# Patient Record
Sex: Male | Born: 1960 | Race: White | Hispanic: No | Marital: Married | Smoking: Never smoker
Health system: Southern US, Community
[De-identification: ages and names within clinical notes are randomized; demographics above are authoritative.]

## PROBLEM LIST (undated history)

## (undated) DIAGNOSIS — E785 Hyperlipidemia, unspecified: Secondary | ICD-10-CM

## (undated) DIAGNOSIS — I82409 Acute embolism and thrombosis of unspecified deep veins of unspecified lower extremity: Secondary | ICD-10-CM

## (undated) DIAGNOSIS — I10 Essential (primary) hypertension: Secondary | ICD-10-CM

## (undated) DIAGNOSIS — Z5189 Encounter for other specified aftercare: Secondary | ICD-10-CM

## (undated) DIAGNOSIS — T7840XA Allergy, unspecified, initial encounter: Secondary | ICD-10-CM

## (undated) DIAGNOSIS — I639 Cerebral infarction, unspecified: Secondary | ICD-10-CM

## (undated) DIAGNOSIS — R011 Cardiac murmur, unspecified: Secondary | ICD-10-CM

## (undated) DIAGNOSIS — D580 Hereditary spherocytosis: Secondary | ICD-10-CM

## (undated) DIAGNOSIS — K219 Gastro-esophageal reflux disease without esophagitis: Secondary | ICD-10-CM

## (undated) DIAGNOSIS — IMO0001 Reserved for inherently not codable concepts without codable children: Secondary | ICD-10-CM

## (undated) HISTORY — DX: Hereditary spherocytosis: D58.0

## (undated) HISTORY — DX: Allergy, unspecified, initial encounter: T78.40XA

## (undated) HISTORY — DX: Gastro-esophageal reflux disease without esophagitis: K21.9

## (undated) HISTORY — DX: Cerebral infarction, unspecified: I63.9

## (undated) HISTORY — DX: Reserved for inherently not codable concepts without codable children: IMO0001

## (undated) HISTORY — DX: Acute embolism and thrombosis of unspecified deep veins of unspecified lower extremity: I82.409

## (undated) HISTORY — PX: UMBILICAL HERNIA REPAIR: SHX196

## (undated) HISTORY — PX: SPLENECTOMY: SUR1306

## (undated) HISTORY — DX: Encounter for other specified aftercare: Z51.89

## (undated) HISTORY — PX: COLONOSCOPY: SHX174

## (undated) HISTORY — PX: VASECTOMY: SHX75

## (undated) HISTORY — DX: Essential (primary) hypertension: I10

## (undated) HISTORY — DX: Hyperlipidemia, unspecified: E78.5

## (undated) HISTORY — DX: Cardiac murmur, unspecified: R01.1

---

## 2006-01-04 ENCOUNTER — Emergency Department (HOSPITAL_COMMUNITY): Admission: EM | Admit: 2006-01-04 | Discharge: 2006-01-05 | Payer: Self-pay | Admitting: Emergency Medicine

## 2006-01-07 ENCOUNTER — Ambulatory Visit: Payer: Self-pay | Admitting: Cardiology

## 2006-01-07 ENCOUNTER — Inpatient Hospital Stay (HOSPITAL_COMMUNITY): Admission: EM | Admit: 2006-01-07 | Discharge: 2006-01-16 | Payer: Self-pay | Admitting: Emergency Medicine

## 2006-01-07 ENCOUNTER — Encounter: Admission: RE | Admit: 2006-01-07 | Discharge: 2006-01-07 | Payer: Self-pay | Admitting: Otolaryngology

## 2006-01-09 ENCOUNTER — Encounter: Payer: Self-pay | Admitting: Cardiology

## 2006-01-10 ENCOUNTER — Encounter: Payer: Self-pay | Admitting: Cardiology

## 2006-01-29 ENCOUNTER — Encounter: Admission: RE | Admit: 2006-01-29 | Discharge: 2006-04-29 | Payer: Self-pay | Admitting: Nurse Practitioner

## 2006-02-15 ENCOUNTER — Ambulatory Visit (HOSPITAL_COMMUNITY): Admission: RE | Admit: 2006-02-15 | Discharge: 2006-02-15 | Payer: Self-pay | Admitting: Ophthalmology

## 2006-03-06 ENCOUNTER — Ambulatory Visit (HOSPITAL_COMMUNITY): Admission: RE | Admit: 2006-03-06 | Discharge: 2006-03-06 | Payer: Self-pay | Admitting: Cardiology

## 2006-03-06 ENCOUNTER — Encounter (INDEPENDENT_AMBULATORY_CARE_PROVIDER_SITE_OTHER): Payer: Self-pay | Admitting: Cardiology

## 2008-07-07 ENCOUNTER — Ambulatory Visit (HOSPITAL_COMMUNITY): Admission: RE | Admit: 2008-07-07 | Discharge: 2008-07-07 | Payer: Self-pay | Admitting: Internal Medicine

## 2010-07-19 ENCOUNTER — Encounter: Payer: Self-pay | Admitting: Gastroenterology

## 2011-01-09 NOTE — Letter (Signed)
Summary: Pre Visit No Show Letter  [facility]  [place], [place] [address]   Phone: [phone]  Fax: [phone]        July 19, 2010 MRN: [id number]    [address] [place], [place]  [address]    Dear Mr. Vendetti,   We have been unable to reach you by phone concerning the pre-procedure visit that you missed on 07/19/10. For this reason,your procedure scheduled on 08/02/10 has been cancelled. Our scheduling staff will gladly assist you with rescheduling your appointments at a more convenient time. Please call our office at [phone] between the hours of 8:00am and 5:00pm, press option #2 to reach an appointment scheduler. Please consider updating your contact numbers at this time so that we can reach you by phone in the future with schedule changes or results.    Thank you,    Emerson Monte RN [facility]

## 2011-04-27 NOTE — Discharge Summary (Signed)
[name], Kurt Lloyd   MEDICAL RECORD NO.:  [id number]          PATIENT TYPE:  INP   LOCATION:  3029                         FACILITY:  [facility]   PHYSICIAN:  Larey Seat, M.D.  DATE OF BIRTH:  07-25-1961   DATE OF ADMISSION:  01/07/2006  DATE OF DISCHARGE:  01/16/2006                                 DISCHARGE SUMMARY   Kurt Lloyd is a 50 year old gentleman with a birth date [birth year] who was admitted on January 07, 2006 after having complaints for  multiple days of vertigo and severe headaches, feeling dizzy, and even  nauseated.  The patient had been sent home from the ER only to have to see  his family doctor who then as I understand ordered a CT scan which showed  bihemispheric cerebellar strokes.  The patient was admitted to the neurology  stroke service through Dr. 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  There  was no hemorrhagic conversion seen.  I would like to further add that Kurt Lloyd has no known history of high blood pressure, diabetes mellitus,  previous strokes, heart disease, nicotine abuse and hyperlipidemia had been  at one time addressed to him but he was not placed on any cholesterol  lowering drugs.  About one month prior to his admission to the hospital he  had transient double vision lasting about a minute.  Then on January 22 he  noted sudden onset of dizziness, vertigo, sharp pain occurring in his neck  radiating up to the occiput.  The headache became generalized later on.  He  had to stay out of work on Tuesday feeling not well.  On Wednesday he had  recurrence of the headache and so was seen in an urgent care by a P.A.  At  that time a CT of the brain was obtained and supposedly interpreted as  normal.  EKG was also obtained as normal.  On Monday,  January 22 he felt  increasing dizziness, lightheadedness, and headache.  During the evening  finally he got some relief but when he drove home his wife noticed that he  could not keep the car straight on the road.  She called 911 and he was  taken to the [facility] Emergency Room where he was treated with Phenergan,  Zofran, Benadryl and released from the hospital.  He slept most of the  coming Saturday and then on Sunday again felt sleepy, but at least had not  the same severity of headache.  Saw an ENT physician, Dr. Constance Holster.  An MRI  study was obtained showing a small right cerebellar stroke, medial right  middle cerebellar peduncle and the right mid brain seemed to be affected.  A  left cerebellar stroke was also noted.  An MRI showed flow abnormalities in  the left vertebral.  The patient's drug screens were negative.  He had been  on aspirin prior, but had not taken  it since Tuesday and he has no history  of known head or neck trauma.   The patient was on hydrocodone and transcobalamin patch.  He is supposed to  be taking aspirin, but was noncompliant.   Further testing here showed a right-sided carotid artery evaluation by  Doppler with the result of a 40-60% ICA stenosis.  No stenosis was seen on  the left ICA.  Right vertebral artery flow was antegrade.  Left vertebral  artery flow was not seen or was too slow to be verified.  A 2-D  echocardiogram was completed showing no source of embolism.  Speech  pathologists saw the patient on the 31st of January and signed off stating  that the patient needs no assistance with communication skills.  The patient  remained through the hospital stay in no acute distress, but was placed on  heparin and Coumadin, the heparin to bridge over until the Coumadin became  therapeutic which was today on January 16, 2006 with an INR of 1.9.  Goal  rate for the Coumadin level is 2-3 INR.  Physical findings at this time  still include right-sided ataxia  of arm and leg, decreased finger-nose test.  Rapid alternating movements are more hesitant on the right but the strength  in general is normal.  There is a slight decrease in right-sided deep tendon  reflexes.  A homocysteine level had been obtained at 14.2  Cholesterol was  185.  Overall HBA1c was 4.9 and patient's physical therapist recommended to  have outpatient OT and PT follow the patient.  A transesophageal  echocardiogram was attached to the chart undated and also showed no source  of embolism.  The patient is today ready for discharge to his home  environment.  He requested to be referred to an internal medicine specialist  in town  and will be released today on 7.5 mg Coumadin daily.  He was placed on  Zocor, baby aspirin 81 daily, Ambien p.r.n. insomnia, and Tylenol 650 mg  p.r.n. for headaches.  Follow up with Dr. Antony Contras in six to eight weeks  after discharge.  I will follow the patient's request and refer him today to  an internist in town.      Larey Seat, M.D.  Electronically Signed     CD/MEDQ  D:  01/16/2006  T:  01/16/2006  Job:  [id number]   cc:   Pramod P. Leonie Man, MD  Fax: [phone]

## 2011-04-27 NOTE — H&P (Signed)
NAMELABIB, BUJNOWSKI NO.:  [id number]   MEDICAL RECORD NO.:  [id number]          PATIENT TYPE:  INP   LOCATION:  3029                         FACILITY:  [facility]   PHYSICIAN:  Alyson Locket. Love, M.D.    DATE OF BIRTH:  [birth year]   DATE OF ADMISSION:  01/07/2006  DATE OF DISCHARGE:                                HISTORY & PHYSICAL   This is the first [facility] admission for this 50 year old right-  handed white married male admitted for evaluation of vertigo, headache,  nausea, vomiting, and abnormal MRI over the past week.   HISTORY OF PRESENT ILLNESS:  Mr. Kurt Lloyd has no known history of high  blood pressure, diabetes mellitus, stroke, heart disease, or cigarette use.  About one month ago he noted the onset of transient double vision lasting a  minute.  On Monday, December 31, 2005 while at work he was eating a sandwich.  Noted the onset of dizziness and a sharp pain occurring in his neck  radiating up the back of his head.  Headache then became generalized.  Because of the headache and discomfort he stayed out of work on Tuesday.  On  Wednesday he had recurrent headache and Thursday was seen at the urgent care  by a physician's associate.  A CAT scan of the brain and EKG were performed  which were normal.  On that Monday, December 31, 2005 he had had dizziness as  well as headache and he intermittently had been having some dizziness since.  On Friday he felt well without headache and was going to go bowling.  During  that evening he was driving, noted the onset of dizziness, then drove home  but his wife noted he was all over the road.  911 was called and he was  taken by ambulance to [facility] Emergency Room where he was having  recurrent headache, nausea and vomiting, and vertigo and was treated with IV  Phenergan, Zofran, and Benadryl.  He then was released from the hospital.  He slept most of Saturday, noted to be sleeping on and off on  Saturday.  Sunday he again was sleepy, but felt better.  He then was seen by Dr. Constance Holster  in his office today for evaluation of this, an ear, nose, and throat  physician and while there it was noted he was having difficulty with his  handwriting.  He, also, had some complaints of numbness in his left arm at  times.  An MRI study of the brain was obtained on the evening of January 07, 2006 showing evidence of a small right cerebellar 1 cm stroke more medial  right middle cerebellar peduncle or cerebellar ischemic stroke and a left  cerebellar stroke.  Also felt that there was an area in the right mid brain  of ischemic stroke.  MRA showed absence of the left vertebral or at least  abnormal flow in the left vertebral and the right vertebral had some distal  stenosis versus a dissection.  He was seen in the emergency room, admitted  for further evaluation.  He had  been on aspirin in the past, but had not  taken any aspirin since Tuesday.  He has not had any head or neck trauma.  He does not use drugs, alcohol, or smoke cigarettes.   SOCIAL HISTORY:  He finished high school.  He works in Network engineer  for the [employer]  He is married and has two children, a daughter  47 and a son 53.  His daughter has spherocytosis and the patient had a  splenectomy at age 65-1/2 years for spherocytosis.   FAMILY HISTORY:  His mother died in her 43s from stroke.  His father is 39,  living and well.  He has four brothers, 61, 80, and 3 living and well.  One  brother died at 75 at a motorcycle accident.  He has a sister 22 living and  well.   Patient, as mentioned, does not drink alcohol or smoke cigarettes.  He has  some allergies to grasses.   MEDICATIONS:  Hydrocodone, transcobalamin patch.  He is supposed to be  taking aspirin, but has not been taking it.   HOSPITALIZATIONS:  1.  Umbilical hernia operation.  2.  Splenectomy at age 23-1/2 years for spherocytosis.  3.  Blood clot in his  hand and arm.   PHYSICAL EXAMINATION:  GENERAL:  Well-developed white male in no acute  distress.  VITAL SIGNS:  Blood pressure right arm 110/60, left arm 120/60, heart rate  64 and regular.  NECK:  There were no carotid or supraclavicular bruits heard.  HEENT:  The tympanic membranes were clear.  The neck flexion/extension  maneuvers were unremarkable.  NEUROLOGIC:  Mental status:  Alert and oriented x3.  Followed one, two, and  three-step commands.  Cranial nerve examination revealed visual fields full.  Disks flat.  Spontaneous venous pulsation showed left pupil larger than the  right.  Red lens testing unremarkable.  No nystagmus.  Corneals present.  Facial sensation equal.  No facial motor asymmetry.  Hearing present.  Air  conduction greater than bone conduction.  Tongue midline.  Uvula midline.  Gags present.  Sternocleidomastoid and trapezius testing normal.  Motor  examination:  5/5 strength proximally and distally in the upper and lower  extremities.  No evidence of pronator or distal drift.  Coordination testing  within normal limits.  Sensory examination intact to pin prick, touch, joint  position and vibration testing.  Deep tendon reflexes 1-2+.  Plantar  responses downgoing.   IMPRESSION:  1.  Vertigo (code 780.4).  2.  Bicerebellar strokes and possibly right mid brain stroke (code 433.31).  3.  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  4.  History of spherocytosis status post splenectomy.   PLAN:  Place the patient on aspirin and admit him for consideration of  arteriography to rule out dissection.           ______________________________  Alyson Locket. Erling Cruz, M.D.     JML/MEDQ  D:  01/07/2006  T:  01/08/2006  Job:  [id number]

## 2012-01-31 ENCOUNTER — Encounter: Payer: Self-pay | Admitting: Internal Medicine

## 2012-03-21 ENCOUNTER — Telehealth: Payer: Self-pay | Admitting: *Deleted

## 2012-03-21 NOTE — Telephone Encounter (Signed)
[facility] previsit to 03/24/12 Room 51 at 4:00pm

## 2012-03-24 ENCOUNTER — Ambulatory Visit (AMBULATORY_SURGERY_CENTER): Payer: 59 | Admitting: *Deleted

## 2012-03-24 VITALS — Ht 67.0 in | Wt 227.7 lb

## 2012-03-24 DIAGNOSIS — Z1211 Encounter for screening for malignant neoplasm of colon: Secondary | ICD-10-CM

## 2012-03-24 MED ORDER — PEG-KCL-NACL-NASULF-NA ASC-C 100 G PO SOLR: ORAL | Status: DC

## 2012-03-24 NOTE — Progress Notes (Signed)
Pt had a colonoscopy in [place]- unsure when or where but states he did have polyps removed

## 2012-04-04 ENCOUNTER — Encounter: Payer: Self-pay | Admitting: Internal Medicine

## 2012-04-04 ENCOUNTER — Ambulatory Visit (AMBULATORY_SURGERY_CENTER): Payer: 59 | Admitting: Internal Medicine

## 2012-04-04 VITALS — BP 127/66 | HR 103 | Temp 97.5°F | Resp 16 | Ht 67.0 in | Wt 227.0 lb

## 2012-04-04 DIAGNOSIS — Z1211 Encounter for screening for malignant neoplasm of colon: Secondary | ICD-10-CM

## 2012-04-04 LAB — GLUCOSE, CAPILLARY
Glucose-Capillary: 159 mg/dL — ABNORMAL HIGH (ref 70–99)
Glucose-Capillary: 140 mg/dL — ABNORMAL HIGH (ref 70–99)

## 2012-04-04 MED ORDER — SODIUM CHLORIDE 0.9 % IV SOLN: 500.0000 mL | INTRAVENOUS | Status: DC

## 2012-04-04 NOTE — Op Note (Signed)
[facility] [address]. [place],   [address]  COLONOSCOPY PROCEDURE REPORT  PATIENT:  Kurt Lloyd, Kurt Lloyd  MR#:  [id number] BIRTHDATE:  12/31/1960, 27 yrs. old  GENDER:  male ENDOSCOPIST:  Gatha Mayer, MD, [facility] REF. BY:  Janalyn Rouse, M.D. PROCEDURE DATE:  04/04/2012 PROCEDURE:  Colonoscopy [id number] ASA CLASS:  Class II INDICATIONS:  Routine Risk Screening MEDICATIONS:   These medications were titrated to patient response per physician's verbal order, Versed 5 mg IV, Fentanyl 50 mcg IV  DESCRIPTION OF PROCEDURE:   After the risks benefits and alternatives of the procedure were thoroughly explained, informed consent was obtained.  Digital rectal exam was performed and revealed no abnormalities and normal prostate.   The LB CF-[id number] endoscope was introduced through the anus and advanced to the cecum, which was identified by both the appendix and ileocecal valve, without limitations.  The quality of the prep was excellent, using MoviPrep.  The instrument was then slowly withdrawn as the colon was fully examined. <<PROCEDUREIMAGES>>  FINDINGS:  A normal appearing cecum, ileocecal valve, and appendiceal orifice were identified. The ascending, hepatic flexure, transverse, splenic flexure, descending, sigmoid colon, and rectum appeared unremarkable.   Retroflexed views in the rectum revealed no abnormalities.    The time to cecum = 1:25 minutes. The scope was then withdrawn in 9:21 minutes from the cecum and the procedure completed. COMPLICATIONS:  None ENDOSCOPIC IMPRESSION: 1) Normal colonoscopy, excellent prep  REPEAT EXAM:  In 10 year(s) for routine screening colonoscopy.  Gatha Mayer, MD, [facility]  CC:  Janalyn Rouse, MD and The Patient  n. eSIGNED:   Gatha Mayer at 04/04/2012 10:07 AM  Irven Easterly, [id number]

## 2012-04-04 NOTE — Patient Instructions (Signed)
YOU HAD AN ENDOSCOPIC PROCEDURE TODAY AT THE [facility]: Refer to the procedure report that was given to you for any specific questions about what was found during the examination.  If the procedure report does not answer your questions, please call your gastroenterologist to clarify.  If you requested that your care partner not be given the details of your procedure findings, then the procedure report has been included in a sealed envelope for you to review at your convenience later.  YOU SHOULD EXPECT: Some feelings of bloating in the abdomen. Passage of more gas than usual.  Walking can help get rid of the air that was put into your GI tract during the procedure and reduce the bloating. If you had a lower endoscopy (such as a colonoscopy or flexible sigmoidoscopy) you may notice spotting of blood in your stool or on the toilet paper. If you underwent a bowel prep for your procedure, then you may not have a normal bowel movement for a few days.  DIET: Your first meal following the procedure should be a light meal and then it is ok to progress to your normal diet.  A half-sandwich or bowl of soup is an example of a good first meal.  Heavy or fried foods are harder to digest and may make you feel nauseous or bloated.  Likewise meals heavy in dairy and vegetables can cause extra gas to form and this can also increase the bloating.  Drink plenty of fluids but you should avoid alcoholic beverages for 24 hours.  ACTIVITY: Your care partner should take you home directly after the procedure.  You should plan to take it easy, moving slowly for the rest of the day.  You can resume normal activity the day after the procedure however you should NOT DRIVE or use heavy machinery for 24 hours (because of the sedation medicines used during the test).    SYMPTOMS TO REPORT IMMEDIATELY: A gastroenterologist can be reached at any hour.  During normal business hours, 8:30 AM to 5:00 PM Monday through Friday,  call [phone].  After hours and on weekends, please call the GI answering service at [phone] who will take a message and have the physician on call contact you.   Following lower endoscopy (colonoscopy or flexible sigmoidoscopy):  Excessive amounts of blood in the stool  Significant tenderness or worsening of abdominal pains  Swelling of the abdomen that is new, acute  Fever of 100F or higher  Following upper endoscopy (EGD)  Vomiting of blood or coffee ground material  New chest pain or pain under the shoulder blades  Painful or persistently difficult swallowing  New shortness of breath  Fever of 100F or higher  Black, tarry-looking stools  FOLLOW UP: If any biopsies were taken you will be contacted by phone or by letter within the next 1-3 weeks.  Call your gastroenterologist if you have not heard about the biopsies in 3 weeks.  Our staff will call the home number listed on your records the next business day following your procedure to check on you and address any questions or concerns that you may have at that time regarding the information given to you following your procedure. This is a courtesy call and so if there is no answer at the home number and we have not heard from you through the emergency physician on call, we will assume that you have returned to your regular daily activities without incident.  SIGNATURES/CONFIDENTIALITY: You and/or your care   partner have signed paperwork which will be entered into your electronic medical record.  These signatures attest to the fact that that the information above on your After Visit Summary has been reviewed and is understood.  Full responsibility of the confidentiality of this discharge information lies with you and/or your care-partner.    Normal colonoscopy, Repeat in 10 years for screening. 2023

## 2012-04-04 NOTE — Progress Notes (Signed)
Patient did not experience any of the following events: a burn prior to discharge; a fall within the facility; wrong site/side/patient/procedure/implant event; or a hospital transfer or hospital admission upon discharge from the facility. (G8907) Patient did not have preoperative order for IV antibiotic SSI prophylaxis. (G8918)  

## 2012-04-07 ENCOUNTER — Telehealth: Payer: Self-pay | Admitting: *Deleted

## 2012-04-07 NOTE — Telephone Encounter (Signed)
  Follow up Call-  Call back number 04/04/2012  Post procedure Call Back phone  # [phone]  Permission to leave phone message Yes     Patient questions:  Do you have a fever, pain , or abdominal swelling? no Pain Score  0 *  Have you tolerated food without any problems? yes  Have you been able to return to your normal activities? yes  Do you have any questions about your discharge instructions: Diet   no Medications  no Follow up visit  no  Do you have questions or concerns about your Care? no  Actions: * If pain score is 4 or above: No action needed, pain <4.

## 2014-12-08 DIAGNOSIS — N5201 Erectile dysfunction due to arterial insufficiency: Secondary | ICD-10-CM | POA: Insufficient documentation

## 2016-12-11 DIAGNOSIS — I10 Essential (primary) hypertension: Secondary | ICD-10-CM | POA: Diagnosis not present

## 2016-12-11 DIAGNOSIS — E784 Other hyperlipidemia: Secondary | ICD-10-CM | POA: Diagnosis not present

## 2016-12-11 DIAGNOSIS — Z124 Encounter for screening for malignant neoplasm of cervix: Secondary | ICD-10-CM | POA: Diagnosis not present

## 2016-12-11 DIAGNOSIS — E119 Type 2 diabetes mellitus without complications: Secondary | ICD-10-CM | POA: Diagnosis not present

## 2016-12-11 DIAGNOSIS — J208 Acute bronchitis due to other specified organisms: Secondary | ICD-10-CM | POA: Diagnosis not present

## 2017-01-03 DIAGNOSIS — J3089 Other allergic rhinitis: Secondary | ICD-10-CM | POA: Diagnosis not present

## 2017-01-03 DIAGNOSIS — J3081 Allergic rhinitis due to animal (cat) (dog) hair and dander: Secondary | ICD-10-CM | POA: Diagnosis not present

## 2017-01-03 DIAGNOSIS — J301 Allergic rhinitis due to pollen: Secondary | ICD-10-CM | POA: Diagnosis not present

## 2017-01-14 DIAGNOSIS — J301 Allergic rhinitis due to pollen: Secondary | ICD-10-CM | POA: Diagnosis not present

## 2017-01-14 DIAGNOSIS — J3081 Allergic rhinitis due to animal (cat) (dog) hair and dander: Secondary | ICD-10-CM | POA: Diagnosis not present

## 2017-01-15 DIAGNOSIS — J3089 Other allergic rhinitis: Secondary | ICD-10-CM | POA: Diagnosis not present

## 2017-02-04 DIAGNOSIS — J3081 Allergic rhinitis due to animal (cat) (dog) hair and dander: Secondary | ICD-10-CM | POA: Diagnosis not present

## 2017-02-04 DIAGNOSIS — J3089 Other allergic rhinitis: Secondary | ICD-10-CM | POA: Diagnosis not present

## 2017-02-04 DIAGNOSIS — J301 Allergic rhinitis due to pollen: Secondary | ICD-10-CM | POA: Diagnosis not present

## 2017-02-11 DIAGNOSIS — J3081 Allergic rhinitis due to animal (cat) (dog) hair and dander: Secondary | ICD-10-CM | POA: Diagnosis not present

## 2017-02-11 DIAGNOSIS — J301 Allergic rhinitis due to pollen: Secondary | ICD-10-CM | POA: Diagnosis not present

## 2017-02-11 DIAGNOSIS — J3089 Other allergic rhinitis: Secondary | ICD-10-CM | POA: Diagnosis not present

## 2017-02-13 DIAGNOSIS — J3081 Allergic rhinitis due to animal (cat) (dog) hair and dander: Secondary | ICD-10-CM | POA: Diagnosis not present

## 2017-02-13 DIAGNOSIS — J301 Allergic rhinitis due to pollen: Secondary | ICD-10-CM | POA: Diagnosis not present

## 2017-02-13 DIAGNOSIS — J3089 Other allergic rhinitis: Secondary | ICD-10-CM | POA: Diagnosis not present

## 2017-02-15 DIAGNOSIS — J3089 Other allergic rhinitis: Secondary | ICD-10-CM | POA: Diagnosis not present

## 2017-02-15 DIAGNOSIS — J3081 Allergic rhinitis due to animal (cat) (dog) hair and dander: Secondary | ICD-10-CM | POA: Diagnosis not present

## 2017-02-15 DIAGNOSIS — J301 Allergic rhinitis due to pollen: Secondary | ICD-10-CM | POA: Diagnosis not present

## 2017-02-18 DIAGNOSIS — J3081 Allergic rhinitis due to animal (cat) (dog) hair and dander: Secondary | ICD-10-CM | POA: Diagnosis not present

## 2017-02-18 DIAGNOSIS — J301 Allergic rhinitis due to pollen: Secondary | ICD-10-CM | POA: Diagnosis not present

## 2017-02-18 DIAGNOSIS — J3089 Other allergic rhinitis: Secondary | ICD-10-CM | POA: Diagnosis not present

## 2017-02-20 DIAGNOSIS — J3081 Allergic rhinitis due to animal (cat) (dog) hair and dander: Secondary | ICD-10-CM | POA: Diagnosis not present

## 2017-02-20 DIAGNOSIS — J3089 Other allergic rhinitis: Secondary | ICD-10-CM | POA: Diagnosis not present

## 2017-02-20 DIAGNOSIS — J301 Allergic rhinitis due to pollen: Secondary | ICD-10-CM | POA: Diagnosis not present

## 2017-02-28 DIAGNOSIS — J301 Allergic rhinitis due to pollen: Secondary | ICD-10-CM | POA: Diagnosis not present

## 2017-02-28 DIAGNOSIS — J3089 Other allergic rhinitis: Secondary | ICD-10-CM | POA: Diagnosis not present

## 2017-02-28 DIAGNOSIS — J3081 Allergic rhinitis due to animal (cat) (dog) hair and dander: Secondary | ICD-10-CM | POA: Diagnosis not present

## 2017-03-04 DIAGNOSIS — J3081 Allergic rhinitis due to animal (cat) (dog) hair and dander: Secondary | ICD-10-CM | POA: Diagnosis not present

## 2017-03-04 DIAGNOSIS — J3089 Other allergic rhinitis: Secondary | ICD-10-CM | POA: Diagnosis not present

## 2017-03-04 DIAGNOSIS — J301 Allergic rhinitis due to pollen: Secondary | ICD-10-CM | POA: Diagnosis not present

## 2017-03-06 DIAGNOSIS — J3089 Other allergic rhinitis: Secondary | ICD-10-CM | POA: Diagnosis not present

## 2017-03-06 DIAGNOSIS — J3081 Allergic rhinitis due to animal (cat) (dog) hair and dander: Secondary | ICD-10-CM | POA: Diagnosis not present

## 2017-03-06 DIAGNOSIS — J301 Allergic rhinitis due to pollen: Secondary | ICD-10-CM | POA: Diagnosis not present

## 2017-03-11 DIAGNOSIS — J3081 Allergic rhinitis due to animal (cat) (dog) hair and dander: Secondary | ICD-10-CM | POA: Diagnosis not present

## 2017-03-11 DIAGNOSIS — J301 Allergic rhinitis due to pollen: Secondary | ICD-10-CM | POA: Diagnosis not present

## 2017-03-11 DIAGNOSIS — J3089 Other allergic rhinitis: Secondary | ICD-10-CM | POA: Diagnosis not present

## 2017-03-13 DIAGNOSIS — J301 Allergic rhinitis due to pollen: Secondary | ICD-10-CM | POA: Diagnosis not present

## 2017-03-13 DIAGNOSIS — J3081 Allergic rhinitis due to animal (cat) (dog) hair and dander: Secondary | ICD-10-CM | POA: Diagnosis not present

## 2017-03-13 DIAGNOSIS — J3089 Other allergic rhinitis: Secondary | ICD-10-CM | POA: Diagnosis not present

## 2017-03-18 DIAGNOSIS — J3081 Allergic rhinitis due to animal (cat) (dog) hair and dander: Secondary | ICD-10-CM | POA: Diagnosis not present

## 2017-03-18 DIAGNOSIS — J301 Allergic rhinitis due to pollen: Secondary | ICD-10-CM | POA: Diagnosis not present

## 2017-03-18 DIAGNOSIS — J3089 Other allergic rhinitis: Secondary | ICD-10-CM | POA: Diagnosis not present

## 2017-03-20 DIAGNOSIS — J301 Allergic rhinitis due to pollen: Secondary | ICD-10-CM | POA: Diagnosis not present

## 2017-03-20 DIAGNOSIS — J3081 Allergic rhinitis due to animal (cat) (dog) hair and dander: Secondary | ICD-10-CM | POA: Diagnosis not present

## 2017-03-20 DIAGNOSIS — J3089 Other allergic rhinitis: Secondary | ICD-10-CM | POA: Diagnosis not present

## 2017-03-25 DIAGNOSIS — J301 Allergic rhinitis due to pollen: Secondary | ICD-10-CM | POA: Diagnosis not present

## 2017-03-25 DIAGNOSIS — J3081 Allergic rhinitis due to animal (cat) (dog) hair and dander: Secondary | ICD-10-CM | POA: Diagnosis not present

## 2017-03-25 DIAGNOSIS — J3089 Other allergic rhinitis: Secondary | ICD-10-CM | POA: Diagnosis not present

## 2017-03-28 DIAGNOSIS — J301 Allergic rhinitis due to pollen: Secondary | ICD-10-CM | POA: Diagnosis not present

## 2017-03-28 DIAGNOSIS — J3081 Allergic rhinitis due to animal (cat) (dog) hair and dander: Secondary | ICD-10-CM | POA: Diagnosis not present

## 2017-03-28 DIAGNOSIS — J3089 Other allergic rhinitis: Secondary | ICD-10-CM | POA: Diagnosis not present

## 2017-04-03 DIAGNOSIS — J3081 Allergic rhinitis due to animal (cat) (dog) hair and dander: Secondary | ICD-10-CM | POA: Diagnosis not present

## 2017-04-03 DIAGNOSIS — J3089 Other allergic rhinitis: Secondary | ICD-10-CM | POA: Diagnosis not present

## 2017-04-03 DIAGNOSIS — J301 Allergic rhinitis due to pollen: Secondary | ICD-10-CM | POA: Diagnosis not present

## 2017-04-05 DIAGNOSIS — J301 Allergic rhinitis due to pollen: Secondary | ICD-10-CM | POA: Diagnosis not present

## 2017-04-05 DIAGNOSIS — J3081 Allergic rhinitis due to animal (cat) (dog) hair and dander: Secondary | ICD-10-CM | POA: Diagnosis not present

## 2017-04-05 DIAGNOSIS — J3089 Other allergic rhinitis: Secondary | ICD-10-CM | POA: Diagnosis not present

## 2017-04-08 DIAGNOSIS — J3081 Allergic rhinitis due to animal (cat) (dog) hair and dander: Secondary | ICD-10-CM | POA: Diagnosis not present

## 2017-04-08 DIAGNOSIS — J3089 Other allergic rhinitis: Secondary | ICD-10-CM | POA: Diagnosis not present

## 2017-04-08 DIAGNOSIS — J301 Allergic rhinitis due to pollen: Secondary | ICD-10-CM | POA: Diagnosis not present

## 2017-04-11 DIAGNOSIS — J3089 Other allergic rhinitis: Secondary | ICD-10-CM | POA: Diagnosis not present

## 2017-04-11 DIAGNOSIS — J301 Allergic rhinitis due to pollen: Secondary | ICD-10-CM | POA: Diagnosis not present

## 2017-04-11 DIAGNOSIS — J3081 Allergic rhinitis due to animal (cat) (dog) hair and dander: Secondary | ICD-10-CM | POA: Diagnosis not present

## 2017-04-16 DIAGNOSIS — J3081 Allergic rhinitis due to animal (cat) (dog) hair and dander: Secondary | ICD-10-CM | POA: Diagnosis not present

## 2017-04-16 DIAGNOSIS — J301 Allergic rhinitis due to pollen: Secondary | ICD-10-CM | POA: Diagnosis not present

## 2017-04-16 DIAGNOSIS — J3089 Other allergic rhinitis: Secondary | ICD-10-CM | POA: Diagnosis not present

## 2017-04-19 DIAGNOSIS — J301 Allergic rhinitis due to pollen: Secondary | ICD-10-CM | POA: Diagnosis not present

## 2017-04-19 DIAGNOSIS — J3089 Other allergic rhinitis: Secondary | ICD-10-CM | POA: Diagnosis not present

## 2017-04-19 DIAGNOSIS — J3081 Allergic rhinitis due to animal (cat) (dog) hair and dander: Secondary | ICD-10-CM | POA: Diagnosis not present

## 2017-04-26 DIAGNOSIS — J3089 Other allergic rhinitis: Secondary | ICD-10-CM | POA: Diagnosis not present

## 2017-04-26 DIAGNOSIS — J301 Allergic rhinitis due to pollen: Secondary | ICD-10-CM | POA: Diagnosis not present

## 2017-04-26 DIAGNOSIS — J3081 Allergic rhinitis due to animal (cat) (dog) hair and dander: Secondary | ICD-10-CM | POA: Diagnosis not present

## 2017-04-29 DIAGNOSIS — J3089 Other allergic rhinitis: Secondary | ICD-10-CM | POA: Diagnosis not present

## 2017-04-29 DIAGNOSIS — J301 Allergic rhinitis due to pollen: Secondary | ICD-10-CM | POA: Diagnosis not present

## 2017-04-29 DIAGNOSIS — J3081 Allergic rhinitis due to animal (cat) (dog) hair and dander: Secondary | ICD-10-CM | POA: Diagnosis not present

## 2017-05-07 DIAGNOSIS — J301 Allergic rhinitis due to pollen: Secondary | ICD-10-CM | POA: Diagnosis not present

## 2017-05-07 DIAGNOSIS — J3081 Allergic rhinitis due to animal (cat) (dog) hair and dander: Secondary | ICD-10-CM | POA: Diagnosis not present

## 2017-05-07 DIAGNOSIS — J3089 Other allergic rhinitis: Secondary | ICD-10-CM | POA: Diagnosis not present

## 2017-05-10 DIAGNOSIS — E119 Type 2 diabetes mellitus without complications: Secondary | ICD-10-CM | POA: Diagnosis not present

## 2017-05-10 DIAGNOSIS — Z1212 Encounter for screening for malignant neoplasm of rectum: Secondary | ICD-10-CM | POA: Diagnosis not present

## 2017-05-10 DIAGNOSIS — Z0000 Encounter for general adult medical examination without abnormal findings: Secondary | ICD-10-CM | POA: Diagnosis not present

## 2017-05-15 DIAGNOSIS — J301 Allergic rhinitis due to pollen: Secondary | ICD-10-CM | POA: Diagnosis not present

## 2017-05-15 DIAGNOSIS — J3089 Other allergic rhinitis: Secondary | ICD-10-CM | POA: Diagnosis not present

## 2017-05-15 DIAGNOSIS — J3081 Allergic rhinitis due to animal (cat) (dog) hair and dander: Secondary | ICD-10-CM | POA: Diagnosis not present

## 2017-05-17 DIAGNOSIS — Z0000 Encounter for general adult medical examination without abnormal findings: Secondary | ICD-10-CM | POA: Diagnosis not present

## 2017-05-17 DIAGNOSIS — Z125 Encounter for screening for malignant neoplasm of prostate: Secondary | ICD-10-CM | POA: Diagnosis not present

## 2017-05-17 DIAGNOSIS — E119 Type 2 diabetes mellitus without complications: Secondary | ICD-10-CM | POA: Diagnosis not present

## 2017-05-17 DIAGNOSIS — I10 Essential (primary) hypertension: Secondary | ICD-10-CM | POA: Diagnosis not present

## 2017-05-17 DIAGNOSIS — E668 Other obesity: Secondary | ICD-10-CM | POA: Diagnosis not present

## 2017-05-17 DIAGNOSIS — Z1389 Encounter for screening for other disorder: Secondary | ICD-10-CM | POA: Diagnosis not present

## 2017-05-17 DIAGNOSIS — E784 Other hyperlipidemia: Secondary | ICD-10-CM | POA: Diagnosis not present

## 2017-05-23 DIAGNOSIS — J301 Allergic rhinitis due to pollen: Secondary | ICD-10-CM | POA: Diagnosis not present

## 2017-05-23 DIAGNOSIS — J3089 Other allergic rhinitis: Secondary | ICD-10-CM | POA: Diagnosis not present

## 2017-05-23 DIAGNOSIS — J3081 Allergic rhinitis due to animal (cat) (dog) hair and dander: Secondary | ICD-10-CM | POA: Diagnosis not present

## 2017-05-28 DIAGNOSIS — J301 Allergic rhinitis due to pollen: Secondary | ICD-10-CM | POA: Diagnosis not present

## 2017-05-28 DIAGNOSIS — J3081 Allergic rhinitis due to animal (cat) (dog) hair and dander: Secondary | ICD-10-CM | POA: Diagnosis not present

## 2017-05-29 DIAGNOSIS — J3089 Other allergic rhinitis: Secondary | ICD-10-CM | POA: Diagnosis not present

## 2017-06-05 DIAGNOSIS — J3089 Other allergic rhinitis: Secondary | ICD-10-CM | POA: Diagnosis not present

## 2017-06-05 DIAGNOSIS — J301 Allergic rhinitis due to pollen: Secondary | ICD-10-CM | POA: Diagnosis not present

## 2017-06-05 DIAGNOSIS — J3081 Allergic rhinitis due to animal (cat) (dog) hair and dander: Secondary | ICD-10-CM | POA: Diagnosis not present

## 2017-06-10 DIAGNOSIS — J3089 Other allergic rhinitis: Secondary | ICD-10-CM | POA: Diagnosis not present

## 2017-06-10 DIAGNOSIS — J301 Allergic rhinitis due to pollen: Secondary | ICD-10-CM | POA: Diagnosis not present

## 2017-06-10 DIAGNOSIS — J3081 Allergic rhinitis due to animal (cat) (dog) hair and dander: Secondary | ICD-10-CM | POA: Diagnosis not present

## 2017-06-20 DIAGNOSIS — J3089 Other allergic rhinitis: Secondary | ICD-10-CM | POA: Diagnosis not present

## 2017-06-20 DIAGNOSIS — J3081 Allergic rhinitis due to animal (cat) (dog) hair and dander: Secondary | ICD-10-CM | POA: Diagnosis not present

## 2017-06-20 DIAGNOSIS — J301 Allergic rhinitis due to pollen: Secondary | ICD-10-CM | POA: Diagnosis not present

## 2017-06-27 DIAGNOSIS — J3081 Allergic rhinitis due to animal (cat) (dog) hair and dander: Secondary | ICD-10-CM | POA: Diagnosis not present

## 2017-06-27 DIAGNOSIS — J3089 Other allergic rhinitis: Secondary | ICD-10-CM | POA: Diagnosis not present

## 2017-06-27 DIAGNOSIS — J301 Allergic rhinitis due to pollen: Secondary | ICD-10-CM | POA: Diagnosis not present

## 2017-07-01 DIAGNOSIS — J3081 Allergic rhinitis due to animal (cat) (dog) hair and dander: Secondary | ICD-10-CM | POA: Diagnosis not present

## 2017-07-01 DIAGNOSIS — J301 Allergic rhinitis due to pollen: Secondary | ICD-10-CM | POA: Diagnosis not present

## 2017-07-01 DIAGNOSIS — J3089 Other allergic rhinitis: Secondary | ICD-10-CM | POA: Diagnosis not present

## 2017-07-05 DIAGNOSIS — J3081 Allergic rhinitis due to animal (cat) (dog) hair and dander: Secondary | ICD-10-CM | POA: Diagnosis not present

## 2017-07-05 DIAGNOSIS — J3089 Other allergic rhinitis: Secondary | ICD-10-CM | POA: Diagnosis not present

## 2017-07-05 DIAGNOSIS — J301 Allergic rhinitis due to pollen: Secondary | ICD-10-CM | POA: Diagnosis not present

## 2017-07-08 DIAGNOSIS — J301 Allergic rhinitis due to pollen: Secondary | ICD-10-CM | POA: Diagnosis not present

## 2017-07-08 DIAGNOSIS — J3081 Allergic rhinitis due to animal (cat) (dog) hair and dander: Secondary | ICD-10-CM | POA: Diagnosis not present

## 2017-07-08 DIAGNOSIS — J3089 Other allergic rhinitis: Secondary | ICD-10-CM | POA: Diagnosis not present

## 2017-07-15 DIAGNOSIS — J3089 Other allergic rhinitis: Secondary | ICD-10-CM | POA: Diagnosis not present

## 2017-07-15 DIAGNOSIS — J301 Allergic rhinitis due to pollen: Secondary | ICD-10-CM | POA: Diagnosis not present

## 2017-07-15 DIAGNOSIS — J3081 Allergic rhinitis due to animal (cat) (dog) hair and dander: Secondary | ICD-10-CM | POA: Diagnosis not present

## 2017-07-22 DIAGNOSIS — J3089 Other allergic rhinitis: Secondary | ICD-10-CM | POA: Diagnosis not present

## 2017-07-22 DIAGNOSIS — J301 Allergic rhinitis due to pollen: Secondary | ICD-10-CM | POA: Diagnosis not present

## 2017-07-22 DIAGNOSIS — J3081 Allergic rhinitis due to animal (cat) (dog) hair and dander: Secondary | ICD-10-CM | POA: Diagnosis not present

## 2017-07-31 DIAGNOSIS — J301 Allergic rhinitis due to pollen: Secondary | ICD-10-CM | POA: Diagnosis not present

## 2017-07-31 DIAGNOSIS — J3089 Other allergic rhinitis: Secondary | ICD-10-CM | POA: Diagnosis not present

## 2017-07-31 DIAGNOSIS — J3081 Allergic rhinitis due to animal (cat) (dog) hair and dander: Secondary | ICD-10-CM | POA: Diagnosis not present

## 2017-08-06 DIAGNOSIS — J3081 Allergic rhinitis due to animal (cat) (dog) hair and dander: Secondary | ICD-10-CM | POA: Diagnosis not present

## 2017-08-06 DIAGNOSIS — J301 Allergic rhinitis due to pollen: Secondary | ICD-10-CM | POA: Diagnosis not present

## 2017-08-06 DIAGNOSIS — J3089 Other allergic rhinitis: Secondary | ICD-10-CM | POA: Diagnosis not present

## 2017-08-13 DIAGNOSIS — J3089 Other allergic rhinitis: Secondary | ICD-10-CM | POA: Diagnosis not present

## 2017-08-13 DIAGNOSIS — J301 Allergic rhinitis due to pollen: Secondary | ICD-10-CM | POA: Diagnosis not present

## 2017-08-13 DIAGNOSIS — J3081 Allergic rhinitis due to animal (cat) (dog) hair and dander: Secondary | ICD-10-CM | POA: Diagnosis not present

## 2017-08-21 DIAGNOSIS — J3081 Allergic rhinitis due to animal (cat) (dog) hair and dander: Secondary | ICD-10-CM | POA: Diagnosis not present

## 2017-08-21 DIAGNOSIS — J301 Allergic rhinitis due to pollen: Secondary | ICD-10-CM | POA: Diagnosis not present

## 2017-08-21 DIAGNOSIS — J3089 Other allergic rhinitis: Secondary | ICD-10-CM | POA: Diagnosis not present

## 2017-08-29 DIAGNOSIS — J3081 Allergic rhinitis due to animal (cat) (dog) hair and dander: Secondary | ICD-10-CM | POA: Diagnosis not present

## 2017-08-29 DIAGNOSIS — J301 Allergic rhinitis due to pollen: Secondary | ICD-10-CM | POA: Diagnosis not present

## 2017-08-29 DIAGNOSIS — J3089 Other allergic rhinitis: Secondary | ICD-10-CM | POA: Diagnosis not present

## 2017-09-02 DIAGNOSIS — J3081 Allergic rhinitis due to animal (cat) (dog) hair and dander: Secondary | ICD-10-CM | POA: Diagnosis not present

## 2017-09-02 DIAGNOSIS — J301 Allergic rhinitis due to pollen: Secondary | ICD-10-CM | POA: Diagnosis not present

## 2017-09-02 DIAGNOSIS — J3089 Other allergic rhinitis: Secondary | ICD-10-CM | POA: Diagnosis not present

## 2017-09-11 DIAGNOSIS — J301 Allergic rhinitis due to pollen: Secondary | ICD-10-CM | POA: Diagnosis not present

## 2017-09-11 DIAGNOSIS — J3089 Other allergic rhinitis: Secondary | ICD-10-CM | POA: Diagnosis not present

## 2017-09-11 DIAGNOSIS — J3081 Allergic rhinitis due to animal (cat) (dog) hair and dander: Secondary | ICD-10-CM | POA: Diagnosis not present

## 2017-09-12 DIAGNOSIS — J3081 Allergic rhinitis due to animal (cat) (dog) hair and dander: Secondary | ICD-10-CM | POA: Diagnosis not present

## 2017-09-12 DIAGNOSIS — J3089 Other allergic rhinitis: Secondary | ICD-10-CM | POA: Diagnosis not present

## 2017-09-12 DIAGNOSIS — J301 Allergic rhinitis due to pollen: Secondary | ICD-10-CM | POA: Diagnosis not present

## 2017-09-17 DIAGNOSIS — J301 Allergic rhinitis due to pollen: Secondary | ICD-10-CM | POA: Diagnosis not present

## 2017-09-17 DIAGNOSIS — J3081 Allergic rhinitis due to animal (cat) (dog) hair and dander: Secondary | ICD-10-CM | POA: Diagnosis not present

## 2017-09-17 DIAGNOSIS — J3089 Other allergic rhinitis: Secondary | ICD-10-CM | POA: Diagnosis not present

## 2017-09-27 DIAGNOSIS — J301 Allergic rhinitis due to pollen: Secondary | ICD-10-CM | POA: Diagnosis not present

## 2017-09-27 DIAGNOSIS — J3089 Other allergic rhinitis: Secondary | ICD-10-CM | POA: Diagnosis not present

## 2017-09-27 DIAGNOSIS — J3081 Allergic rhinitis due to animal (cat) (dog) hair and dander: Secondary | ICD-10-CM | POA: Diagnosis not present

## 2017-10-02 DIAGNOSIS — J301 Allergic rhinitis due to pollen: Secondary | ICD-10-CM | POA: Diagnosis not present

## 2017-10-02 DIAGNOSIS — J3089 Other allergic rhinitis: Secondary | ICD-10-CM | POA: Diagnosis not present

## 2017-10-02 DIAGNOSIS — J3081 Allergic rhinitis due to animal (cat) (dog) hair and dander: Secondary | ICD-10-CM | POA: Diagnosis not present

## 2017-10-11 DIAGNOSIS — J3081 Allergic rhinitis due to animal (cat) (dog) hair and dander: Secondary | ICD-10-CM | POA: Diagnosis not present

## 2017-10-11 DIAGNOSIS — J3089 Other allergic rhinitis: Secondary | ICD-10-CM | POA: Diagnosis not present

## 2017-10-11 DIAGNOSIS — J301 Allergic rhinitis due to pollen: Secondary | ICD-10-CM | POA: Diagnosis not present

## 2017-10-14 DIAGNOSIS — Z23 Encounter for immunization: Secondary | ICD-10-CM | POA: Diagnosis not present

## 2017-10-14 DIAGNOSIS — G5700 Lesion of sciatic nerve, unspecified lower limb: Secondary | ICD-10-CM | POA: Diagnosis not present

## 2017-10-14 DIAGNOSIS — J301 Allergic rhinitis due to pollen: Secondary | ICD-10-CM | POA: Diagnosis not present

## 2017-10-14 DIAGNOSIS — I10 Essential (primary) hypertension: Secondary | ICD-10-CM | POA: Diagnosis not present

## 2017-10-14 DIAGNOSIS — E119 Type 2 diabetes mellitus without complications: Secondary | ICD-10-CM | POA: Diagnosis not present

## 2017-10-14 DIAGNOSIS — J3081 Allergic rhinitis due to animal (cat) (dog) hair and dander: Secondary | ICD-10-CM | POA: Diagnosis not present

## 2017-10-14 DIAGNOSIS — J3089 Other allergic rhinitis: Secondary | ICD-10-CM | POA: Diagnosis not present

## 2017-10-14 DIAGNOSIS — E7849 Other hyperlipidemia: Secondary | ICD-10-CM | POA: Diagnosis not present

## 2017-10-21 DIAGNOSIS — J3089 Other allergic rhinitis: Secondary | ICD-10-CM | POA: Diagnosis not present

## 2017-10-21 DIAGNOSIS — J301 Allergic rhinitis due to pollen: Secondary | ICD-10-CM | POA: Diagnosis not present

## 2017-10-21 DIAGNOSIS — J3081 Allergic rhinitis due to animal (cat) (dog) hair and dander: Secondary | ICD-10-CM | POA: Diagnosis not present

## 2017-10-29 DIAGNOSIS — J301 Allergic rhinitis due to pollen: Secondary | ICD-10-CM | POA: Diagnosis not present

## 2017-10-29 DIAGNOSIS — J3081 Allergic rhinitis due to animal (cat) (dog) hair and dander: Secondary | ICD-10-CM | POA: Diagnosis not present

## 2017-10-30 DIAGNOSIS — J301 Allergic rhinitis due to pollen: Secondary | ICD-10-CM | POA: Diagnosis not present

## 2017-10-30 DIAGNOSIS — J3089 Other allergic rhinitis: Secondary | ICD-10-CM | POA: Diagnosis not present

## 2017-10-30 DIAGNOSIS — J3081 Allergic rhinitis due to animal (cat) (dog) hair and dander: Secondary | ICD-10-CM | POA: Diagnosis not present

## 2017-11-05 DIAGNOSIS — J301 Allergic rhinitis due to pollen: Secondary | ICD-10-CM | POA: Diagnosis not present

## 2017-11-05 DIAGNOSIS — J3089 Other allergic rhinitis: Secondary | ICD-10-CM | POA: Diagnosis not present

## 2017-11-05 DIAGNOSIS — J3081 Allergic rhinitis due to animal (cat) (dog) hair and dander: Secondary | ICD-10-CM | POA: Diagnosis not present

## 2017-11-11 DIAGNOSIS — J301 Allergic rhinitis due to pollen: Secondary | ICD-10-CM | POA: Diagnosis not present

## 2017-11-11 DIAGNOSIS — J3089 Other allergic rhinitis: Secondary | ICD-10-CM | POA: Diagnosis not present

## 2017-11-11 DIAGNOSIS — J3081 Allergic rhinitis due to animal (cat) (dog) hair and dander: Secondary | ICD-10-CM | POA: Diagnosis not present

## 2017-11-19 DIAGNOSIS — J3081 Allergic rhinitis due to animal (cat) (dog) hair and dander: Secondary | ICD-10-CM | POA: Diagnosis not present

## 2017-11-19 DIAGNOSIS — J3089 Other allergic rhinitis: Secondary | ICD-10-CM | POA: Diagnosis not present

## 2017-11-19 DIAGNOSIS — J301 Allergic rhinitis due to pollen: Secondary | ICD-10-CM | POA: Diagnosis not present

## 2017-11-22 DIAGNOSIS — J3081 Allergic rhinitis due to animal (cat) (dog) hair and dander: Secondary | ICD-10-CM | POA: Diagnosis not present

## 2017-11-22 DIAGNOSIS — J301 Allergic rhinitis due to pollen: Secondary | ICD-10-CM | POA: Diagnosis not present

## 2017-11-22 DIAGNOSIS — J3089 Other allergic rhinitis: Secondary | ICD-10-CM | POA: Diagnosis not present

## 2017-11-26 DIAGNOSIS — J3089 Other allergic rhinitis: Secondary | ICD-10-CM | POA: Diagnosis not present

## 2017-11-26 DIAGNOSIS — J301 Allergic rhinitis due to pollen: Secondary | ICD-10-CM | POA: Diagnosis not present

## 2017-11-26 DIAGNOSIS — J3081 Allergic rhinitis due to animal (cat) (dog) hair and dander: Secondary | ICD-10-CM | POA: Diagnosis not present

## 2017-11-29 DIAGNOSIS — J301 Allergic rhinitis due to pollen: Secondary | ICD-10-CM | POA: Diagnosis not present

## 2017-11-29 DIAGNOSIS — J3081 Allergic rhinitis due to animal (cat) (dog) hair and dander: Secondary | ICD-10-CM | POA: Diagnosis not present

## 2017-11-29 DIAGNOSIS — J3089 Other allergic rhinitis: Secondary | ICD-10-CM | POA: Diagnosis not present

## 2017-12-04 DIAGNOSIS — J301 Allergic rhinitis due to pollen: Secondary | ICD-10-CM | POA: Diagnosis not present

## 2017-12-04 DIAGNOSIS — J3081 Allergic rhinitis due to animal (cat) (dog) hair and dander: Secondary | ICD-10-CM | POA: Diagnosis not present

## 2017-12-04 DIAGNOSIS — J3089 Other allergic rhinitis: Secondary | ICD-10-CM | POA: Diagnosis not present

## 2017-12-06 DIAGNOSIS — J3089 Other allergic rhinitis: Secondary | ICD-10-CM | POA: Diagnosis not present

## 2017-12-11 DIAGNOSIS — J301 Allergic rhinitis due to pollen: Secondary | ICD-10-CM | POA: Diagnosis not present

## 2017-12-11 DIAGNOSIS — J3081 Allergic rhinitis due to animal (cat) (dog) hair and dander: Secondary | ICD-10-CM | POA: Diagnosis not present

## 2017-12-11 DIAGNOSIS — J3089 Other allergic rhinitis: Secondary | ICD-10-CM | POA: Diagnosis not present

## 2017-12-23 DIAGNOSIS — J3081 Allergic rhinitis due to animal (cat) (dog) hair and dander: Secondary | ICD-10-CM | POA: Diagnosis not present

## 2017-12-23 DIAGNOSIS — J3089 Other allergic rhinitis: Secondary | ICD-10-CM | POA: Diagnosis not present

## 2017-12-23 DIAGNOSIS — J301 Allergic rhinitis due to pollen: Secondary | ICD-10-CM | POA: Diagnosis not present

## 2018-01-02 DIAGNOSIS — J301 Allergic rhinitis due to pollen: Secondary | ICD-10-CM | POA: Diagnosis not present

## 2018-01-02 DIAGNOSIS — J3089 Other allergic rhinitis: Secondary | ICD-10-CM | POA: Diagnosis not present

## 2018-01-02 DIAGNOSIS — J3081 Allergic rhinitis due to animal (cat) (dog) hair and dander: Secondary | ICD-10-CM | POA: Diagnosis not present

## 2018-01-06 DIAGNOSIS — J3089 Other allergic rhinitis: Secondary | ICD-10-CM | POA: Diagnosis not present

## 2018-01-06 DIAGNOSIS — J3081 Allergic rhinitis due to animal (cat) (dog) hair and dander: Secondary | ICD-10-CM | POA: Diagnosis not present

## 2018-01-06 DIAGNOSIS — J301 Allergic rhinitis due to pollen: Secondary | ICD-10-CM | POA: Diagnosis not present

## 2018-01-14 DIAGNOSIS — J301 Allergic rhinitis due to pollen: Secondary | ICD-10-CM | POA: Diagnosis not present

## 2018-01-14 DIAGNOSIS — J3089 Other allergic rhinitis: Secondary | ICD-10-CM | POA: Diagnosis not present

## 2018-01-14 DIAGNOSIS — J3081 Allergic rhinitis due to animal (cat) (dog) hair and dander: Secondary | ICD-10-CM | POA: Diagnosis not present

## 2018-02-17 DIAGNOSIS — J301 Allergic rhinitis due to pollen: Secondary | ICD-10-CM | POA: Diagnosis not present

## 2018-02-17 DIAGNOSIS — J3081 Allergic rhinitis due to animal (cat) (dog) hair and dander: Secondary | ICD-10-CM | POA: Diagnosis not present

## 2018-02-17 DIAGNOSIS — J3089 Other allergic rhinitis: Secondary | ICD-10-CM | POA: Diagnosis not present

## 2018-02-27 DIAGNOSIS — J3089 Other allergic rhinitis: Secondary | ICD-10-CM | POA: Diagnosis not present

## 2018-02-27 DIAGNOSIS — J3081 Allergic rhinitis due to animal (cat) (dog) hair and dander: Secondary | ICD-10-CM | POA: Diagnosis not present

## 2018-02-27 DIAGNOSIS — J301 Allergic rhinitis due to pollen: Secondary | ICD-10-CM | POA: Diagnosis not present

## 2018-12-02 ENCOUNTER — Ambulatory Visit: Payer: Federal, State, Local not specified - PPO | Admitting: Cardiovascular Disease

## 2018-12-16 ENCOUNTER — Encounter: Payer: Self-pay | Admitting: Cardiovascular Disease

## 2018-12-16 ENCOUNTER — Ambulatory Visit (INDEPENDENT_AMBULATORY_CARE_PROVIDER_SITE_OTHER): Payer: Federal, State, Local not specified - PPO | Admitting: Cardiovascular Disease

## 2018-12-16 VITALS — BP 108/68 | HR 72 | Ht 67.0 in | Wt 200.0 lb

## 2018-12-16 DIAGNOSIS — R002 Palpitations: Secondary | ICD-10-CM | POA: Insufficient documentation

## 2018-12-16 DIAGNOSIS — R0989 Other specified symptoms and signs involving the circulatory and respiratory systems: Secondary | ICD-10-CM | POA: Diagnosis not present

## 2018-12-16 DIAGNOSIS — I10 Essential (primary) hypertension: Secondary | ICD-10-CM | POA: Diagnosis not present

## 2018-12-16 DIAGNOSIS — E785 Hyperlipidemia, unspecified: Secondary | ICD-10-CM | POA: Insufficient documentation

## 2018-12-16 NOTE — Assessment & Plan Note (Signed)
History of palpitations new onset since 6 months ago that occur randomly 2 or 3 times a month lasting minutes at a time.  He had a Kardia of ice which ported his rhythm as an outpatient.  He showed me strips on his iPad which did show what appears to be PAF or PSVT at a rate of 160.  We will obtain a 2D echocardiogram and a 30-day event monitor to further evaluate.  His TSH recently performed was normal he does drink occasional caffeine but this is not something that is a daily habit.

## 2018-12-16 NOTE — Assessment & Plan Note (Signed)
History of hyperlipidemia on high-dose statin therapy with lipid profile performed 06/09/2018 revealing total cholesterol 109, LDL 47 and HDL of 35.

## 2018-12-16 NOTE — Progress Notes (Signed)
12/16/2018 Kurt Lloyd   Sep 25, 1961  [id number]  Primary Physician Marton Redwood, MD Primary Cardiologist: Lorretta Harp MD Lupe Carney, [place]  HPI:  Kurt Lloyd is a 58 y.o. moderately overweight married Caucasian male father of 2, grandfather 2 grandchildren who is retired from working for [employer].  He was referred by Dr. Brigitte Pulse for cardiovascular valuation because of new onset tachypalpitations.  His cardiac risk factor profile is notable for treated hypertension, diabetes and hyperlipidemia.  There is no family history of heart disease.  He is never had a heart attack but has had 2 strokes back in 2007 without neurologic deficit.  He is fairly active and denies chest pain with exertion.  He had new onset tachypalpitations beginning 6 months ago that they now occur 2-3 times a month lasting up to 5 minutes at a time associated with some chest heaviness and shortness of breath.   Current Meds  Medication Sig  . aspirin 81 MG tablet Take 81 mg by mouth daily.  . fexofenadine (ALLEGRA) 180 MG tablet Take 180 mg by mouth daily.  . fluticasone (FLONASE) 50 MCG/ACT nasal spray Place 1 spray into both nostrils daily.  Marland Kitchen lisinopril (PRINIVIL,ZESTRIL) 10 MG tablet Take 10 mg by mouth daily.  . metFORMIN (GLUCOPHAGE) 500 MG tablet Take 1,000 mg by mouth 2 (two) times daily with a meal.  . Olopatadine HCl 0.6 % SOLN Place 2 sprays into the nose 2 (two) times daily.  . rosuvastatin (CRESTOR) 40 MG tablet Take 40 mg by mouth daily.  . [DISCONTINUED] NON FORMULARY Takes [employer] version of Zyrtec- 1 tablet daily  . [DISCONTINUED] Rosuvastatin Calcium (CRESTOR PO) Take 1 tablet by mouth daily.     No Known Allergies  Social History   Socioeconomic History  . Marital status: Married    Spouse name: Not on file  . Number of children: Not on file  . Years of education: Not on file  . Highest education level: Not on file  Occupational History  . Not on file    Social Needs  . Financial resource strain: Not on file  . Food insecurity:    Worry: Not on file    Inability: Not on file  . Transportation needs:    Medical: Not on file    Non-medical: Not on file  Tobacco Use  . Smoking status: Never Smoker  . Smokeless tobacco: Never Used  Substance and Sexual Activity  . Alcohol use: Yes    Comment: occasionally  . Drug use: No  . Sexual activity: Not on file  Lifestyle  . Physical activity:    Days per week: Not on file    Minutes per session: Not on file  . Stress: Not on file  Relationships  . Social connections:    Talks on phone: Not on file    Gets together: Not on file    Attends religious service: Not on file    Active member of club or organization: Not on file    Attends meetings of clubs or organizations: Not on file    Relationship status: Not on file  . Intimate partner violence:    Fear of current or ex partner: Not on file    Emotionally abused: Not on file    Physically abused: Not on file    Forced sexual activity: Not on file  Other Topics Concern  . Not on file  Social History Narrative  . Not on  file     Review of Systems: General: negative for chills, fever, night sweats or weight changes.  Cardiovascular: negative for chest pain, dyspnea on exertion, edema, orthopnea, palpitations, paroxysmal nocturnal dyspnea or shortness of breath Dermatological: negative for rash Respiratory: negative for cough or wheezing Urologic: negative for hematuria Abdominal: negative for nausea, vomiting, diarrhea, bright red blood per rectum, melena, or hematemesis Neurologic: negative for visual changes, syncope, or dizziness All other systems reviewed and are otherwise negative except as noted above.    Blood pressure 108/68, pulse 72, height 5\' 7"  (1.702 m), weight 200 lb (90.7 kg).  General appearance: alert and no distress Neck: no adenopathy, no JVD, supple, symmetrical, trachea midline, thyroid not enlarged,  symmetric, no tenderness/mass/nodules and Soft bilateral carotid bruits Lungs: clear to auscultation bilaterally Heart: regular rate and rhythm, S1, S2 normal, no murmur, click, rub or gallop Extremities: extremities normal, atraumatic, no cyanosis or edema Pulses: 2+ and symmetric Skin: Skin color, texture, turgor normal. No rashes or lesions Neurologic: Alert and oriented X 3, normal strength and tone. Normal symmetric reflexes. Normal coordination and gait  EKG sinus rhythm at 72 without ST or T wave changes.  Personally reviewed this EKG  ASSESSMENT AND PLAN:   Palpitations History of palpitations new onset since 6 months ago that occur randomly 2 or 3 times a month lasting minutes at a time.  He had a Kardia of ice which ported his rhythm as an outpatient.  He showed me strips on his iPad which did show what appears to be PAF or PSVT at a rate of 160.  We will obtain a 2D echocardiogram and a 30-day event monitor to further evaluate.  His TSH recently performed was normal he does drink occasional caffeine but this is not something that is a daily habit.  Essential hypertension History of essential hypertension with blood pressure measured today at 108/68.  He is on lisinopril.  Continue current meds at current dosing.  Hyperlipidemia History of hyperlipidemia on high-dose statin therapy with lipid profile performed 06/09/2018 revealing total cholesterol 109, LDL 47 and HDL of 35.      Lorretta Harp MD FACP,FACC,FAHA, [facility] 12/16/2018 12:50 PM

## 2018-12-16 NOTE — Assessment & Plan Note (Signed)
History of essential hypertension with blood pressure measured today at 108/68.  He is on lisinopril.  Continue current meds at current dosing.

## 2018-12-16 NOTE — Patient Instructions (Signed)
Medication Instructions:  NO CHANGES If you need a refill on your cardiac medications before your next appointment, please call your pharmacy.   Lab work: NONE If you have labs (blood work) drawn today and your tests are completely normal, you will receive your results only by: Marland Kitchen MyChart Message (if you have MyChart) OR . A paper copy in the mail If you have any lab test that is abnormal or we need to change your treatment, we will call you to review the results.  Testing/Procedures: Your physician has requested that you have a carotid duplex. This test is an ultrasound of the carotid arteries in your neck. It looks at blood flow through these arteries that supply the brain with blood. Allow one hour for this exam. There are no restrictions or special instructions.  Your physician has requested that you have an exercise tolerance test. For further information please visit [email or web address]. Please also follow instruction sheet, as given.  Your physician has requested that you have an echocardiogram. Echocardiography is a painless test that uses sound waves to create images of your heart. It provides your doctor with information about the size and shape of your heart and how well your heart's chambers and valves are working. This procedure takes approximately one hour. There are no restrictions for this procedure.  Your physician has recommended that you wear a 30 DAY event monitor. Event monitors are medical devices that record the heart's electrical activity. Doctors most often [place] these monitors to diagnose arrhythmias. Arrhythmias are problems with the speed or rhythm of the heartbeat. The monitor is a small, portable device. You can wear one while you do your normal daily activities. This is usually used to diagnose what is causing palpitations/syncope (passing out).   Follow-Up: At [facility], you and your health needs are our priority.  As part of our continuing mission to provide  you with exceptional heart care, we have created designated Provider Care Teams.  These Care Teams include your primary Cardiologist (physician) and Advanced Practice Providers (APPs -  Physician Assistants and Nurse Practitioners) who all work together to provide you with the care you need, when you need it. . You will need a follow up appointment in 6 weeks.  You may see Dr. Gwenlyn Found or one of the following Advanced Practice Providers on your designated Care Team:   . Kerin Ransom, [place] . Almyra Deforest, PA-C . Fabian Sharp, PA-C . Jory Sims, DNP . Rosaria Ferries, PA-C . Roby Lofts, PA-C . Sande Rives, PA-C

## 2018-12-23 ENCOUNTER — Telehealth (HOSPITAL_COMMUNITY): Payer: Self-pay

## 2018-12-23 NOTE — Telephone Encounter (Signed)
Encounter complete. 

## 2018-12-25 ENCOUNTER — Ambulatory Visit (HOSPITAL_COMMUNITY)
Admission: RE | Admit: 2018-12-25 | Discharge: 2018-12-25 | Disposition: A | Discharge status: Home / Self Care | Payer: Federal, State, Local not specified - PPO | Source: Ambulatory Visit | Attending: Cardiology | Admitting: Cardiology

## 2018-12-25 ENCOUNTER — Ambulatory Visit (HOSPITAL_BASED_OUTPATIENT_CLINIC_OR_DEPARTMENT_OTHER)
Admission: RE | Admit: 2018-12-25 | Discharge: 2018-12-25 | Disposition: A | Discharge status: Home / Self Care | Payer: Federal, State, Local not specified - PPO | Source: Ambulatory Visit | Attending: Cardiovascular Disease | Admitting: Cardiovascular Disease

## 2018-12-25 DIAGNOSIS — R0989 Other specified symptoms and signs involving the circulatory and respiratory systems: Secondary | ICD-10-CM | POA: Diagnosis not present

## 2018-12-25 DIAGNOSIS — R002 Palpitations: Secondary | ICD-10-CM | POA: Diagnosis present

## 2018-12-25 LAB — EXERCISE TOLERANCE TEST
Estimated workload: 10.1 METS
Exercise duration (min): 8 min
Exercise duration (sec): 17 s
MPHR: 161 {beats}/min
Peak HR: 148 {beats}/min
Percent HR: 91 %
RPE: 17
Rest HR: 60 {beats}/min

## 2018-12-29 ENCOUNTER — Other Ambulatory Visit: Payer: Self-pay

## 2018-12-29 ENCOUNTER — Ambulatory Visit (HOSPITAL_COMMUNITY): Payer: Federal, State, Local not specified - PPO | Attending: Cardiology

## 2018-12-29 ENCOUNTER — Ambulatory Visit (INDEPENDENT_AMBULATORY_CARE_PROVIDER_SITE_OTHER): Payer: Federal, State, Local not specified - PPO

## 2018-12-29 DIAGNOSIS — R002 Palpitations: Secondary | ICD-10-CM

## 2019-01-16 ENCOUNTER — Telehealth: Payer: Self-pay | Admitting: *Deleted

## 2019-01-16 NOTE — Telephone Encounter (Signed)
[employer] sent serious event notification . Recording shows sinus rhythm with SVT (57 SEC) W/COUPLETS  PACs.Spoke with pt and was eating dinner at time of episode could feel the palpitations and also pt complaining of low B/P Lisinopril was currently decreased to 10 mg from 20 back in Oct 2019  Discussed with Dr Gwenlyn Found continue to monitor and keep upcoming appt and may reduce Lisinopril to 5 mg . Pt aware of recommendations and agrees with plan ./cy

## 2019-01-21 ENCOUNTER — Telehealth: Payer: Self-pay | Admitting: *Deleted

## 2019-01-22 NOTE — Telephone Encounter (Signed)
Late entry  Received report from [employer] from 2/8 read by Dr Gwenlyn Found 2/11 as Afib AVR. Patient feels tired during episodes but when they subside feels fine. Episodes generally last about a minute or less but has had them last an hour or so. Dr Gwenlyn Found wanted patient seen by NP/PA this week. When I spoke with patient 2/11 he was not going to be in town until 2/18. Scheduled appointment with Janan Ridge PA on 2/19. He does notice worse after caffeine. Advised patient to avoid caffeine and if he has episode that lasts more than one hour to go to ED. Patient verbalized understanding.

## 2019-01-28 ENCOUNTER — Ambulatory Visit: Payer: Federal, State, Local not specified - PPO | Admitting: Physician Assistant

## 2019-01-28 ENCOUNTER — Encounter: Payer: Self-pay | Admitting: Physician Assistant

## 2019-01-28 VITALS — BP 130/66 | HR 56 | Ht 67.0 in | Wt 189.8 lb

## 2019-01-28 DIAGNOSIS — I10 Essential (primary) hypertension: Secondary | ICD-10-CM | POA: Diagnosis not present

## 2019-01-28 DIAGNOSIS — E785 Hyperlipidemia, unspecified: Secondary | ICD-10-CM

## 2019-01-28 DIAGNOSIS — Z8673 Personal history of transient ischemic attack (TIA), and cerebral infarction without residual deficits: Secondary | ICD-10-CM

## 2019-01-28 DIAGNOSIS — R002 Palpitations: Secondary | ICD-10-CM

## 2019-01-28 DIAGNOSIS — E119 Type 2 diabetes mellitus without complications: Secondary | ICD-10-CM

## 2019-01-28 NOTE — Progress Notes (Signed)
Cardiology Office Note    Date:  01/30/2019   ID:  Kurt Lloyd, DOB June 14, 1961, MRN [id number]  PCP:  Marton Redwood, MD  Cardiologist:  Dr. Gwenlyn Found   Chief Complaint  Patient presents with  . Follow-up    seen for Dr. Gwenlyn Found.     History of Present Illness:  Kurt Lloyd is a 58 y.o. male with PMH of hypertension, hyperlipidemia, GERD, history of CVA x2 and DM2.  For the past 70-month, patient has been having some palpitation.  He uses a Kardia device to record his rhythm.  He presented to strips on his iPad to Dr. Gwenlyn Found during the visit in January 2020, this appears to show either PAF or PSVT at her heart rate of 160.  Echocardiogram and a 30-day event monitor was recommended.  ETT obtained on 12/25/2018 showed good exercise tolerance, no chest pain, no ST changes.  Carotid Doppler obtained on 1/16 only showed very mild disease on both side.  Echocardiogram obtained on 12/29/2018 showed EF 55 to 60%, grade 1 DD, atrial septal aneurysm.  Patient presents today for cardiology office visit.  It is unclear to me whether or not the patient has A. fib, 2-1 atrial flutter versus SVT.  His symptoms seems to be triggered by caffeinated drinks and alcohol.  I advised him to stop both.  Interestingly he says when he take a deep breath, this will sometimes convert him out of the irregular rhythm.  We discussed the possibility of adding a rate control agent, he wished to hold off at this time.  I discussed the case with Dr. Gwenlyn Found, we will refer the patient to electrophysiology service.  We discussed the possibility of trying the Valsalva maneuver to see if this will help.  We also discussed carotid massage as well.   Past Medical History:  Diagnosis Date  . Allergy   . Blood transfusion   . Diabetes mellitus   . GERD (gastroesophageal reflux disease)   . Heart murmur   . Hereditary spherocytosis ([place])   . Hyperlipidemia   . Hypertension   . Stroke [facility])    x2 2007    Past Surgical  History:  Procedure Laterality Date  . COLONOSCOPY    . SPLENECTOMY    . VASECTOMY      Current Medications: Outpatient Medications Prior to Visit  Medication Sig Dispense Refill  . aspirin 81 MG tablet Take 81 mg by mouth daily.    Marland Kitchen EPINEPHrine 0.3 mg/0.3 mL IJ SOAJ injection     . fexofenadine (ALLEGRA) 180 MG tablet Take 180 mg by mouth daily.    . fluticasone (FLONASE) 50 MCG/ACT nasal spray Place 1 spray into both nostrils daily.    Marland Kitchen lisinopril (PRINIVIL,ZESTRIL) 5 MG tablet Take 5 mg by mouth daily.     . metFORMIN (GLUCOPHAGE) 500 MG tablet Take 1,000 mg by mouth 2 (two) times daily with a meal.    . Olopatadine HCl 0.6 % SOLN Place 2 sprays into the nose 2 (two) times daily.    . rosuvastatin (CRESTOR) 40 MG tablet Take 40 mg by mouth daily.     No facility-administered medications prior to visit.      Allergies:   Patient has no known allergies.   Social History   Socioeconomic History  . Marital status: Married    Spouse name: Not on file  . Number of children: Not on file  . Years of education: Not on file  . Highest education level:  Not on file  Occupational History  . Not on file  Social Needs  . Financial resource strain: Not on file  . Food insecurity:    Worry: Not on file    Inability: Not on file  . Transportation needs:    Medical: Not on file    Non-medical: Not on file  Tobacco Use  . Smoking status: Never Smoker  . Smokeless tobacco: Never Used  Substance and Sexual Activity  . Alcohol use: Yes    Comment: occasionally  . Drug use: No  . Sexual activity: Not on file  Lifestyle  . Physical activity:    Days per week: Not on file    Minutes per session: Not on file  . Stress: Not on file  Relationships  . Social connections:    Talks on phone: Not on file    Gets together: Not on file    Attends religious service: Not on file    Active member of club or organization: Not on file    Attends meetings of clubs or organizations: Not on  file    Relationship status: Not on file  Other Topics Concern  . Not on file  Social History Narrative  . Not on file     Family History:  The patient's family history includes Hypertension in his father; Lung cancer in his father; Stroke in his mother.   ROS:   Please see the history of present illness.    ROS All other systems reviewed and are negative.   PHYSICAL EXAM:   VS:  BP 130/66   Pulse (!) 56   Ht 5\' 7"  (1.702 m)   Wt 189 lb 12.8 oz (86.1 kg)   SpO2 98%   BMI 29.73 kg/m    GEN: Well nourished, well developed, in no acute distress  HEENT: normal  Neck: no JVD, carotid bruits, or masses Cardiac: RRR; no murmurs, rubs, or gallops,no edema  Respiratory:  clear to auscultation bilaterally, normal work of breathing GI: soft, nontender, nondistended, + BS MS: no deformity or atrophy  Skin: warm and dry, no rash Neuro:  Alert and Oriented x 3, Strength and sensation are intact Psych: euthymic mood, full affect  Wt Readings from Last 3 Encounters:  01/28/19 189 lb 12.8 oz (86.1 kg)  12/16/18 200 lb (90.7 kg)  04/04/12 227 lb (103 kg)      Studies/Labs Reviewed:   EKG:  EKG is ordered today.  The ekg ordered today demonstrates normal sinus rhythm with PACs  Recent Labs: No results found for requested labs within last 8760 hours.   Lipid Panel No results found for: CHOL, TRIG, HDL, CHOLHDL, VLDL, LDLCALC, LDLDIRECT  Additional studies/ records that were reviewed today include:   Echo 12/29/2018 LV EF: 55% -   60%  ------------------------------------------------------------------- Indications:      (R00.2).  ------------------------------------------------------------------- History:   PMH:  Palpitations. Acquired from the patient and from the patient&'s chart.  Risk factors:  Hypertension. Diabetes mellitus. Obese. Dyslipidemia.  ------------------------------------------------------------------- Study Conclusions  - Left ventricle: The cavity  size was normal. Wall thickness was   normal. Systolic function was normal. The estimated ejection   fraction was in the range of 55% to 60%. Wall motion was normal;   there were no regional wall motion abnormalities. Doppler   parameters are consistent with abnormal left ventricular   relaxation (grade 1 diastolic dysfunction). - Atrial septum: There was an atrial septal aneurysm.  Impressions:  - Normal LV systolic function; mild  diastolic dysfunction;    ASSESSMENT:    1. Palpitations   2. Essential hypertension   3. Hyperlipidemia, unspecified hyperlipidemia type   4. H/O: CVA (cerebrovascular accident)   5. Controlled type 2 diabetes mellitus without complication, without long-term current use of insulin (HCC)      PLAN:  In order of problems listed above:  1. Palpitation: It is unclear based on the recent monitor strip whether the patient had 2-1 atrial flutter versus SVT.  I am hesitant to place the patient on chronic systemic anticoagulation without confirming the diagnosis.  I discussed the case with Dr. Gwenlyn Found, we will refer the patient to electrophysiology service for further evaluation.  I discussed with the patient Valsalva maneuver and carotid massage is well.  I advised him to cut back on caffeine and alcohol which are major contributing factors  2. Hypertension: Blood pressure stable on current therapy  3. Hyperlipidemia: On Crestor 40 mg daily  4. DM2: Managed by primary care provider.  On metformin  5. History of CVA: No recurrence    Medication Adjustments/Labs and Tests Ordered: Current medicines are reviewed at length with the patient today.  Concerns regarding medicines are outlined above.  Medication changes, Labs and Tests ordered today are listed in the Patient Instructions below. Patient Instructions  Medication Instructions:  Your physician recommends that you continue on your current medications as directed. Please refer to the Current  Medication list given to you today.  If you need a refill on your cardiac medications before your next appointment, please call your pharmacy.   Lab work: NONE  If you have labs (blood work) drawn today and your tests are completely normal, you will receive your results only by: Marland Kitchen MyChart Message (if you have MyChart) OR . A paper copy in the mail If you have any lab test that is abnormal or we need to change your treatment, we will call you to review the results.  Testing/Procedures: NONE  Follow-Up: At [facility], you and your health needs are our priority.  As part of our continuing mission to provide you with exceptional heart care, we have created designated Provider Care Teams.  These Care Teams include your primary Cardiologist (physician) and Advanced Practice Providers (APPs -  Physician Assistants and Nurse Practitioners) who all work together to provide you with the care you need, when you need it. . You will need a follow up appointment in 3 months with Quay Burow, MD   Will refer you Electrophysiology at [facility] office    Any Other Special Instructions Will Be Listed Below (If Applicable). Cut back or stop eating, drinking caffeine. Cut back or stop drinking alcohol.         Hilbert Corrigan, [place]  01/30/2019 2:53 PM    [facility] [place], [place], [place]  [address] Phone: [phone]; Fax: [phone]

## 2019-01-28 NOTE — Progress Notes (Signed)
ekg 

## 2019-01-28 NOTE — Patient Instructions (Signed)
Medication Instructions:  Your physician recommends that you continue on your current medications as directed. Please refer to the Current Medication list given to you today.  If you need a refill on your cardiac medications before your next appointment, please call your pharmacy.   Lab work: NONE  If you have labs (blood work) drawn today and your tests are completely normal, you will receive your results only by: Marland Kitchen MyChart Message (if you have MyChart) OR . A paper copy in the mail If you have any lab test that is abnormal or we need to change your treatment, we will call you to review the results.  Testing/Procedures: NONE  Follow-Up: At [facility], you and your health needs are our priority.  As part of our continuing mission to provide you with exceptional heart care, we have created designated Provider Care Teams.  These Care Teams include your primary Cardiologist (physician) and Advanced Practice Providers (APPs -  Physician Assistants and Nurse Practitioners) who all work together to provide you with the care you need, when you need it. . You will need a follow up appointment in 3 months with Quay Burow, MD   Will refer you Electrophysiology at [facility] office    Any Other Special Instructions Will Be Listed Below (If Applicable). Cut back or stop eating, drinking caffeine. Cut back or stop drinking alcohol.

## 2019-01-30 ENCOUNTER — Encounter: Payer: Self-pay | Admitting: Physician Assistant

## 2019-02-05 ENCOUNTER — Encounter: Payer: Self-pay | Admitting: Internal Medicine

## 2019-02-05 ENCOUNTER — Telehealth: Payer: Self-pay

## 2019-02-05 ENCOUNTER — Telehealth: Payer: Self-pay | Admitting: Internal Medicine

## 2019-02-05 ENCOUNTER — Ambulatory Visit: Payer: Federal, State, Local not specified - PPO | Admitting: Internal Medicine

## 2019-02-05 VITALS — BP 112/62 | HR 70 | Ht 67.0 in | Wt 187.0 lb

## 2019-02-05 DIAGNOSIS — R002 Palpitations: Secondary | ICD-10-CM | POA: Diagnosis not present

## 2019-02-05 DIAGNOSIS — I471 Supraventricular tachycardia: Secondary | ICD-10-CM

## 2019-02-05 DIAGNOSIS — I10 Essential (primary) hypertension: Secondary | ICD-10-CM | POA: Diagnosis not present

## 2019-02-05 DIAGNOSIS — Z8673 Personal history of transient ischemic attack (TIA), and cerebral infarction without residual deficits: Secondary | ICD-10-CM | POA: Diagnosis not present

## 2019-02-05 NOTE — Patient Instructions (Addendum)
Medication Instructions:  Your physician recommends that you continue on your current medications as directed. Please refer to the Current Medication list given to you today.  Labwork: None ordered.  Testing/Procedures: Your physician has recommended that you have an ablation. Catheter ablation is a medical procedure used to treat some cardiac arrhythmias (irregular heartbeats). During catheter ablation, a long, thin, flexible tube is put into a blood vessel in your groin (upper thigh), or neck. This tube is called an ablation catheter. It is then guided to your heart through the blood vessel. Radio frequency waves destroy small areas of heart tissue where abnormal heartbeats may cause an arrhythmia to start. Please see the instruction sheet given to you today.   The following days are available for procedures:  March 16, 20, 23, 25, 27, 30 April 3, 6, 9, 15, 17, 20, 23, 27, 29  If you decide on a day please give me a call:  Bing Neighbors [phone]    Cardiac Ablation Cardiac ablation is a procedure to disable (ablate) a small amount of heart tissue in very specific places. The heart has many electrical connections. Sometimes these connections are abnormal and can cause the heart to beat very fast or irregularly. Ablating some of the problem areas can improve the heart rhythm or return it to normal. Ablation may be done for people who:  Have Wolff-Parkinson-White syndrome.  Have fast heart rhythms (tachycardia).  Have taken medicines for an abnormal heart rhythm (arrhythmia) that were not effective or caused side effects.  Have a high-risk heartbeat that may be life-threatening. During the procedure, a small incision is made in the neck or the groin, and a long, thin, flexible tube (catheter) is inserted into the incision and moved to the heart. Small devices (electrodes) on the tip of the catheter will send out electrical currents. A type of X-ray (fluoroscopy) will be used to help guide  the catheter and to provide images of the heart. Tell a health care provider about:  Any allergies you have.  All medicines you are taking, including vitamins, herbs, eye drops, creams, and over-the-counter medicines.  Any problems you or family members have had with anesthetic medicines.  Any blood disorders you have.  Any surgeries you have had.  Any medical conditions you have, such as kidney failure.  Whether you are pregnant or may be pregnant. What are the risks? Generally, this is a safe procedure. However, problems may occur, including:  Infection.  Bruising and bleeding at the catheter insertion site.  Bleeding into the chest, especially into the sac that surrounds the heart. This is a serious complication.  Stroke or blood clots.  Damage to other structures or organs.  Allergic reaction to medicines or dyes.  Need for a permanent pacemaker if the normal electrical system is damaged. A pacemaker is a small computer that sends electrical signals to the heart and helps your heart beat normally.  The procedure not being fully effective. This may not be recognized until months later. Repeat ablation procedures are sometimes required. What happens before the procedure?  Follow instructions from your health care provider about eating or drinking restrictions.  Ask your health care provider about: ? Changing or stopping your regular medicines. This is especially important if you are taking diabetes medicines or blood thinners. ? Taking medicines such as aspirin and ibuprofen. These medicines can thin your blood. Do not take these medicines before your procedure if your health care provider instructs you not to.  Plan to have  someone take you home from the hospital or clinic.  If you will be going home right after the procedure, plan to have someone with you for 24 hours. What happens during the procedure?  To lower your risk of infection: ? Your health care team will  wash or sanitize their hands. ? Your skin will be washed with soap. ? Hair may be removed from the incision area.  An IV tube will be inserted into one of your veins.  You will be given a medicine to help you relax (sedative).  The skin on your neck or groin will be numbed.  An incision will be made in your neck or your groin.  A needle will be inserted through the incision and into a large vein in your neck or groin.  A catheter will be inserted into the needle and moved to your heart.  Dye may be injected through the catheter to help your surgeon see the area of the heart that needs treatment.  Electrical currents will be sent from the catheter to ablate heart tissue in desired areas. There are three types of energy that may be used to ablate heart tissue: ? Heat (radiofrequency energy). ? Laser energy. ? Extreme cold (cryoablation).  When the necessary tissue has been ablated, the catheter will be removed.  Pressure will be held on the catheter insertion area to prevent excessive bleeding.  A bandage (dressing) will be placed over the catheter insertion area. The procedure may vary among health care providers and hospitals. What happens after the procedure?  Your blood pressure, heart rate, breathing rate, and blood oxygen level will be monitored until the medicines you were given have worn off.  Your catheter insertion area will be monitored for bleeding. You will need to lie still for a few hours to ensure that you do not bleed from the catheter insertion area.  Do not drive for 24 hours or as long as directed by your health care provider. Summary  Cardiac ablation is a procedure to disable (ablate) a small amount of heart tissue in very specific places. Ablating some of the problem areas can improve the heart rhythm or return it to normal.  During the procedure, electrical currents will be sent from the catheter to ablate heart tissue in desired areas. This information  is not intended to replace advice given to you by your health care provider. Make sure you discuss any questions you have with your health care provider. Document Released: 04/14/2009 Document Revised: 10/15/2016 Document Reviewed: 10/15/2016 Elsevier Interactive Patient Education  2019 [employer].

## 2019-02-05 NOTE — Telephone Encounter (Signed)
Pt aware of results of recent cardiac monitoring and states appt scheduled for 2/27 with electrophysiologist. Will keep ROV appt with Dr. Gwenlyn Found.

## 2019-02-05 NOTE — Telephone Encounter (Signed)
New Message   Patient calling to schedule ablation appointment.

## 2019-02-05 NOTE — Progress Notes (Signed)
HPI Mr. Kurt Lloyd is referred by Almyra Deforest for evaluation of SVT. He is a pleasant middle aged man with palpitations for over a year. He has been on medical therapy. He thinks that the episodes are less frequent with a reduction in caffeine and etoh. He does not use either in excess. He wore a cardiac monitor which demonstrated SVT at 160-170/min. In SVT he feels chest pressure. No syncope.  No Known Allergies   Current Outpatient Medications  Medication Sig Dispense Refill  . aspirin 81 MG tablet Take 81 mg by mouth daily.    Marland Kitchen EPINEPHrine 0.3 mg/0.3 mL IJ SOAJ injection     . fexofenadine (ALLEGRA) 180 MG tablet Take 180 mg by mouth daily.    . fluticasone (FLONASE) 50 MCG/ACT nasal spray Place 1 spray into both nostrils daily.    Marland Kitchen lisinopril (PRINIVIL,ZESTRIL) 5 MG tablet Take 5 mg by mouth daily.     . metFORMIN (GLUCOPHAGE) 500 MG tablet Take 1,000 mg by mouth 2 (two) times daily with a meal.    . Olopatadine HCl 0.6 % SOLN Place 2 sprays into the nose 2 (two) times daily.    . rosuvastatin (CRESTOR) 40 MG tablet Take 40 mg by mouth daily.     No current facility-administered medications for this visit.      Past Medical History:  Diagnosis Date  . Allergy   . Blood transfusion   . Diabetes mellitus   . GERD (gastroesophageal reflux disease)   . Heart murmur   . Hereditary spherocytosis ([place])   . Hyperlipidemia   . Hypertension   . Stroke [facility])    x2 2007    ROS:   All systems reviewed and negative except as noted in the HPI.   Past Surgical History:  Procedure Laterality Date  . COLONOSCOPY    . SPLENECTOMY    . VASECTOMY       Family History  Problem Relation Age of Onset  . Stroke Mother   . Lung cancer Father   . Hypertension Father   . Colon cancer Neg Hx   . Esophageal cancer Neg Hx   . Rectal cancer Neg Hx   . Stomach cancer Neg Hx      Social History   Socioeconomic History  . Marital status: Married    Spouse name: Not on file  .  Number of children: Not on file  . Years of education: Not on file  . Highest education level: Not on file  Occupational History  . Not on file  Social Needs  . Financial resource strain: Not on file  . Food insecurity:    Worry: Not on file    Inability: Not on file  . Transportation needs:    Medical: Not on file    Non-medical: Not on file  Tobacco Use  . Smoking status: Never Smoker  . Smokeless tobacco: Never Used  Substance and Sexual Activity  . Alcohol use: Yes    Comment: occasionally  . Drug use: No  . Sexual activity: Not on file  Lifestyle  . Physical activity:    Days per week: Not on file    Minutes per session: Not on file  . Stress: Not on file  Relationships  . Social connections:    Talks on phone: Not on file    Gets together: Not on file    Attends religious service: Not on file    Active member of club or organization:  Not on file    Attends meetings of clubs or organizations: Not on file    Relationship status: Not on file  . Intimate partner violence:    Fear of current or ex partner: Not on file    Emotionally abused: Not on file    Physically abused: Not on file    Forced sexual activity: Not on file  Other Topics Concern  . Not on file  Social History Narrative  . Not on file     BP 112/62   Pulse 70   Ht 5\' 7"  (1.702 m)   Wt 187 lb (84.8 kg)   SpO2 95%   BMI 29.29 kg/m   Physical Exam:  Well appearing NAD HEENT: Unremarkable Neck:  No JVD, no thyromegally Lymphatics:  No adenopathy Back:  No CVA tenderness Lungs:  Clear with no wheezes HEART:  Regular rate rhythm, no murmurs, no rubs, no clicks Abd:  soft, positive bowel sounds, no organomegally, no rebound, no guarding Ext:  2 plus pulses, no edema, no cyanosis, no clubbing Skin:  No rashes no nodules Neuro:  CN II through XII intact, motor grossly intact  EKG - reviewed. NSR   Assess/Plan: 1. SVT - I have discussed the treatment options with the patient and  recommended EP study and ablation. He will call us if he wishes to proceed. 2. HTN - his bp has been a little low lately. He is encouraged to hold his meds.  Mikle Bosworth.D.

## 2019-02-06 ENCOUNTER — Ambulatory Visit: Payer: Federal, State, Local not specified - PPO | Admitting: Cardiovascular Disease

## 2019-02-06 NOTE — Telephone Encounter (Signed)
Call placed to Pt.  Per Pt would like to schedule ablation for March 09 2019 at 7:30 am.  Pt will come to office for lab work on March 23.

## 2019-02-23 ENCOUNTER — Telehealth: Payer: Self-pay | Admitting: Internal Medicine

## 2019-02-23 NOTE — Telephone Encounter (Signed)
New Message   Pt is calling because he has a procedure scheduled 03/30 and wants to know if he needs to keep it or reschedule  Please call back

## 2019-02-25 NOTE — Telephone Encounter (Signed)
Called patient to let him know his SVT Ablation needs to be rescheduled due to [place].  His preAblation labs will also be rescheduled at a later date when we are able to reschedule his procedure.  He verbally agreed and is ok with this decision.

## 2019-03-02 ENCOUNTER — Other Ambulatory Visit: Payer: Federal, State, Local not specified - PPO

## 2019-03-09 ENCOUNTER — Encounter (HOSPITAL_COMMUNITY): Payer: Self-pay

## 2019-03-09 ENCOUNTER — Ambulatory Visit (HOSPITAL_COMMUNITY): Admit: 2019-03-09 | Payer: Federal, State, Local not specified - PPO | Admitting: Internal Medicine

## 2019-03-09 SURGERY — SVT ABLATION

## 2019-04-09 ENCOUNTER — Ambulatory Visit: Payer: Federal, State, Local not specified - PPO | Admitting: Internal Medicine

## 2019-04-28 ENCOUNTER — Telehealth: Payer: Self-pay

## 2019-04-28 ENCOUNTER — Telehealth (INDEPENDENT_AMBULATORY_CARE_PROVIDER_SITE_OTHER): Payer: Federal, State, Local not specified - PPO | Admitting: Cardiovascular Disease

## 2019-04-28 DIAGNOSIS — I480 Paroxysmal atrial fibrillation: Secondary | ICD-10-CM

## 2019-04-28 DIAGNOSIS — I10 Essential (primary) hypertension: Secondary | ICD-10-CM

## 2019-04-28 DIAGNOSIS — I471 Supraventricular tachycardia: Secondary | ICD-10-CM

## 2019-04-28 DIAGNOSIS — E782 Mixed hyperlipidemia: Secondary | ICD-10-CM

## 2019-04-28 DIAGNOSIS — R002 Palpitations: Secondary | ICD-10-CM

## 2019-04-28 NOTE — Patient Instructions (Addendum)
Medication Instructions:  Your physician recommends that you continue on your current medications as directed. Please refer to the Current Medication list given to you today.  If you need a refill on your cardiac medications before your next appointment, please call your pharmacy.   Lab work: You will need a COVID-19 test done 3 days prior to your sleep study and remain quarantined after your COVID-19 test until your sleep study. You will need an appointment for your COVID-19 test.   . Location: [facility] [address] [place], [place] [address]  If you have labs (blood work) drawn today and your tests are completely normal, you will receive your results only by: Marland Kitchen MyChart Message (if you have MyChart) OR . A paper copy in the mail If you have any lab test that is abnormal or we need to change your treatment, we will call you to review the results.  Testing/Procedures: Your physician has recommended that you have a sleep study. This test records several body functions during sleep, including: brain activity, eye movement, oxygen and carbon dioxide blood levels, heart rate and rhythm, breathing rate and rhythm, the flow of air through your mouth and nose, snoring, body muscle movements, and chest and belly movement.   Follow-Up: At [facility], you and your health needs are our priority.  As part of our continuing mission to provide you with exceptional heart care, we have created designated Provider Care Teams.  These Care Teams include your primary Cardiologist (physician) and Advanced Practice Providers (APPs -  Physician Assistants and Nurse Practitioners) who all work together to provide you with the care you need, when you need it. You will need a follow up appointment in 6 months WITH DR. Gwenlyn Found.  Please call our office 2 months in advance to schedule this appointment.

## 2019-04-28 NOTE — Telephone Encounter (Signed)
Patient and/or DPR-approved person aware of AVS instructions and verbalized understanding. Letter including After Visit Summary and any other necessary documents to be mailed to the patient's address on file.  

## 2019-04-28 NOTE — Addendum Note (Signed)
Addended by: Annita Brod on: 04/28/2019 10:35 AM   Modules accepted: Orders, SmartSet

## 2019-04-28 NOTE — Progress Notes (Signed)
Virtual Visit via Video Note   This visit type was conducted due to national recommendations for restrictions regarding the COVID-19 Pandemic (e.g. social distancing) in an effort to limit this patient's exposure and mitigate transmission in our community.  Due to his co-morbid illnesses, this patient is at least at moderate risk for complications without adequate follow up.  This format is felt to be most appropriate for this patient at this time.  All issues noted in this document were discussed and addressed.  A limited physical exam was performed with this format.  Please refer to the patient's chart for his consent to telehealth for [facility]   Date:  04/28/2019   ID:  Kurt Lloyd, DOB 03-14-1961, MRN [id number]  Patient Location: Home Provider Location: Home  PCP:  Marton Redwood, MD  Cardiologist:  Quay Burow, MD  Electrophysiologist:  None   Evaluation Performed:  Follow-Up Visit  Chief Complaint: SVT  History of Present Illness:    Kurt Lloyd is a 58 y.o. moderately overweight married Caucasian male father of 2, grandfather 2 grandchildren who is retired from working for [employer].  He was referred by Dr. Brigitte Pulse for cardiovascular valuation because of new onset tachypalpitations.  I last saw him in the office 12/16/2018. His cardiac risk factor profile is notable for treated hypertension, diabetes and hyperlipidemia.  There is no family history of heart disease.  He is never had a heart attack but has had 2 strokes back in 2007 without neurologic deficit.  He is fairly active and denies chest pain with exertion.  He had new onset tachypalpitations beginning 6 months ago that they now occur 2-3 times a month lasting up to 5 minutes at a time associated with some chest heaviness and shortness of breath.  Since I saw him in the office 5 months ago I did order several tests including a GXT which was normal on 12/25/2018, 2D echo which was normal on  12/29/2018, carotid Dopplers which were normal on 12/25/2018 and an event monitor that showed PSVT and short runs of PAF/PA flutter.  I did refer him to Dr. Lovena Le who had arranged to perform ablation at the end of March but this was postponed because of COVID-19.  The patient does say that his episodes are brought on by caffeine and alcohol which she has tried to limit.  Dr. Brigitte Pulse follows his lipid profile.  The patient does not have symptoms concerning for COVID-19 infection (fever, chills, cough, or new shortness of breath).    Past Medical History:  Diagnosis Date  . Allergy   . Blood transfusion   . Diabetes mellitus   . GERD (gastroesophageal reflux disease)   . Heart murmur   . Hereditary spherocytosis ([place])   . Hyperlipidemia   . Hypertension   . Stroke [facility])    x2 2007   Past Surgical History:  Procedure Laterality Date  . COLONOSCOPY    . SPLENECTOMY    . VASECTOMY       No outpatient medications have been marked as taking for the 04/28/19 encounter (Appointment) with Lorretta Harp, MD.     Allergies:   Patient has no known allergies.   Social History   Tobacco Use  . Smoking status: Never Smoker  . Smokeless tobacco: Never Used  Substance Use Topics  . Alcohol use: Yes    Comment: occasionally  . Drug use: No     Family Hx: The patient's family history includes Hypertension  in his father; Lung cancer in his father; Stroke in his mother. There is no history of Colon cancer, Esophageal cancer, Rectal cancer, or Stomach cancer.  ROS:   Please see the history of present illness.     All other systems reviewed and are negative.   Prior CV studies:   The following studies were reviewed today:  Event monitor, 2D echo, GXT, carotid Dopplers.  Labs/Other Tests and Data Reviewed:    EKG:  No ECG reviewed.  Recent Labs: No results found for requested labs within last 8760 hours.   Recent Lipid Panel No results found for: CHOL, TRIG, HDL, CHOLHDL,  LDLCALC, LDLDIRECT  Wt Readings from Last 3 Encounters:  02/05/19 187 lb (84.8 kg)  01/28/19 189 lb 12.8 oz (86.1 kg)  12/16/18 200 lb (90.7 kg)     Objective:    Vital Signs:  There were no vitals taken for this visit.   VITAL SIGNS:  reviewed GEN:  no acute distress RESPIRATORY:  normal respiratory effort, symmetric expansion NEURO:  alert and oriented x 3, no obvious focal deficit PSYCH:  normal affect  ASSESSMENT & PLAN:    1. 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 Lovena Le.  He was scheduled for an ablation at the end of March but this was postponed because of COVID-19.  This will be rescheduled by Dr. Lovena Le once elective procedures are relaxed.  The patient does admit to having increased symptoms of palpitations when he drinks caffeine or alcohol which he is try to limit.  I am also going to order an outpatient sleep study to determine whether or not this is contributory. 2. Hyperlipidemia- on Crestor followed by his PCP 3. Essential hypertension- on antihypertensive medications up until April when this was discontinued by Dr. Brigitte Pulse.  His blood pressure today measured at home by himself was 118/62.  COVID-19 Education: The signs and symptoms of COVID-19 were discussed with the patient and how to seek care for testing (follow up with PCP or arrange E-visit).  The importance of social distancing was discussed today.  Time:   Today, I have spent 7 minutes with the patient with telehealth technology discussing the above problems.     Medication Adjustments/Labs and Tests Ordered: Current medicines are reviewed at length with the patient today.  Concerns regarding medicines are outlined above.   Tests Ordered: No orders of the defined types were placed in this encounter.   Medication Changes: No orders of the defined types were placed in this encounter.   Disposition:  Follow up in 6 month(s)  Signed, Quay Burow, MD  04/28/2019 7:59 AM     [place]

## 2019-06-04 ENCOUNTER — Telehealth: Payer: Self-pay

## 2019-06-04 NOTE — Telephone Encounter (Signed)
Call placed to Pt.  Pt would like to discuss procedure with Dr. Lovena Le but also schedule  appt made on 7/7  Scheduled procedure for 7/10

## 2019-06-10 ENCOUNTER — Telehealth: Payer: Self-pay | Admitting: Internal Medicine

## 2019-06-10 NOTE — Telephone Encounter (Signed)
New Message ° ° ° °Left message to confirm appt and answer COVID questions  °

## 2019-06-15 ENCOUNTER — Other Ambulatory Visit: Payer: Self-pay

## 2019-06-15 ENCOUNTER — Ambulatory Visit: Payer: Federal, State, Local not specified - PPO | Admitting: Internal Medicine

## 2019-06-15 ENCOUNTER — Encounter: Payer: Self-pay | Admitting: Internal Medicine

## 2019-06-15 VITALS — BP 130/68 | HR 51 | Ht 67.0 in | Wt 186.4 lb

## 2019-06-15 DIAGNOSIS — I471 Supraventricular tachycardia, unspecified: Secondary | ICD-10-CM

## 2019-06-15 DIAGNOSIS — R002 Palpitations: Secondary | ICD-10-CM | POA: Diagnosis not present

## 2019-06-15 NOTE — Patient Instructions (Addendum)
Medication Instructions:  Your physician recommends that you continue on your current medications as directed. Please refer to the Current Medication list given to you today.  Labwork: You will get lab work today:  BMP and CBC  Testing/Procedures: Your physician has recommended that you have an ablation. Catheter ablation is a medical procedure used to treat some cardiac arrhythmias (irregular heartbeats). During catheter ablation, a long, thin, flexible tube is put into a blood vessel in your groin (upper thigh), or neck. This tube is called an ablation catheter. It is then guided to your heart through the blood vessel. Radio frequency waves destroy small areas of heart tissue where abnormal heartbeats may cause an arrhythmia to start. Please see the instruction sheet given to you today.   Follow-Up: Your physician wants you to follow-up in:   4 weeks with Dr. Lovena Le.      Electrophysiology/Ablation Procedure Instructions   You are scheduled for a(n) supraventricular tachycardia (SVT) ablation on June 19, 2019 with Dr. Lovena Le   1.   Pre procedure testing-             COVID TEST--go to the [facility] [phone]) for your Covid testing.  You are scheduled for June 17, 2019 at 9:00 am.  This is a drive thru test site.  Stay in your car and the nurse team will come to your car to test you.  After you are tested please go home and self quarantine until the day of your procedure.  This will prevent any possible exposure between the test and your procedure.   2. On the day of your procedure June 19, 2019 you will go to [facility] [phone]) at 5:30 am.  Dennis Bast will go to the main entrance A [employer]) and enter where the [employer] are.  Your driver will drop you off and you will head down the hallway to ADMITTING.  At this time they are only allowing patient's to enter the building.  (This may change.  Please contact me closer to your procedure to see  if this guidance has changed.)   3.   Do not eat or drink after midnight prior to your procedure.   4.   Do NOT take any medications the morning of your procedure.   5.  Plan for an overnight stay but you may be discharged.  If you use your phone frequently bring your phone charger.   6.  You will follow up with Dr. Lovena Le 4 weeks after your procedure.  This appointment will be made for you.   * If you have ANY questions please call the office [phone] and ask for [facility] or send me a MyChart message   * Occasionally, EP Studies and ablations can become lengthy.  Please make your family aware of this before your procedure starts.  Average time ranges from 2-8 hours for EP studies/ablations.  Your physician will call your family after the procedure with the results.

## 2019-06-15 NOTE — Progress Notes (Signed)
HPI Mr. Liou returns today for ongoing evaluation and management of his SVT. I saw him 4 months ago for palpitations and he was noted to have SVT at 160/min. The episodes would stop and start suddenly. He had some improvement when he reduced his caffeine intake. He was scheduled to undergo catheter ablation in March but this was cancelled due to the Covid pandemic. In the interim, he has done fairly well with recurrent palpitations. He is able to stop these with valsalva.   No Known Allergies   Current Outpatient Medications  Medication Sig Dispense Refill  . aspirin EC 81 MG tablet Take 81 mg by mouth daily.    Marland Kitchen EPINEPHrine 0.3 mg/0.3 mL IJ SOAJ injection Inject 0.3 mg into the muscle as needed for anaphylaxis (allergy shot).     . fexofenadine (ALLEGRA) 180 MG tablet Take 180 mg by mouth daily.    . fluticasone (FLONASE) 50 MCG/ACT nasal spray Place 1 spray into both nostrils daily.    . metFORMIN (GLUCOPHAGE) 500 MG tablet Take 1,000 mg by mouth 2 (two) times a day.     . Olopatadine HCl 0.6 % SOLN Place 2 sprays into the nose 2 (two) times daily.    Marland Kitchen OVER THE COUNTER MEDICATION Inject 1 Dose as directed once a week. Allergy Shots    . Polyethyl Glycol-Propyl Glycol (SYSTANE) 0.4-0.3 % SOLN Place 1 drop into both eyes 3 (three) times daily as needed (dry/irritated eyes.).    Marland Kitchen rosuvastatin (CRESTOR) 40 MG tablet Take 40 mg by mouth daily.     No current facility-administered medications for this visit.      Past Medical History:  Diagnosis Date  . Allergy   . Blood transfusion   . Diabetes mellitus   . GERD (gastroesophageal reflux disease)   . Heart murmur   . Hereditary spherocytosis ([place])   . Hyperlipidemia   . Hypertension   . Stroke [facility])    x2 2007    ROS:   All systems reviewed and negative except as noted in the HPI.   Past Surgical History:  Procedure Laterality Date  . COLONOSCOPY    . SPLENECTOMY    . VASECTOMY       Family History   Problem Relation Age of Onset  . Stroke Mother   . Lung cancer Father   . Hypertension Father   . Colon cancer Neg Hx   . Esophageal cancer Neg Hx   . Rectal cancer Neg Hx   . Stomach cancer Neg Hx      Social History   Socioeconomic History  . Marital status: Married    Spouse name: Not on file  . Number of children: Not on file  . Years of education: Not on file  . Highest education level: Not on file  Occupational History  . Not on file  Social Needs  . Financial resource strain: Not on file  . Food insecurity    Worry: Not on file    Inability: Not on file  . Transportation needs    Medical: Not on file    Non-medical: Not on file  Tobacco Use  . Smoking status: Never Smoker  . Smokeless tobacco: Never Used  Substance and Sexual Activity  . Alcohol use: Yes    Comment: occasionally  . Drug use: No  . Sexual activity: Not on file  Lifestyle  . Physical activity    Days per week: Not on file  Minutes per session: Not on file  . Stress: Not on file  Relationships  . Social Herbalist on phone: Not on file    Gets together: Not on file    Attends religious service: Not on file    Active member of club or organization: Not on file    Attends meetings of clubs or organizations: Not on file    Relationship status: Not on file  . Intimate partner violence    Fear of current or ex partner: Not on file    Emotionally abused: Not on file    Physically abused: Not on file    Forced sexual activity: Not on file  Other Topics Concern  . Not on file  Social History Narrative  . Not on file     Ht 5\' 7"  (1.702 m)   Wt 186 lb 6.4 oz (84.6 kg)   BMI 29.19 kg/m   Physical Exam:  Well appearing NAD HEENT: Unremarkable Neck:  No JVD Lungs:  No increased work of breathing HEART:  Regular rate rhythm Abd:  Non-distended Ext:  No edema Skin:  No rashes no nodules Neuro:  CN II through XII intact, motor grossly intact  EKG - nsr with no  pre-excitation  Assess/Plan: 1. SVT - I have discussed the indications/risks/benefits/goals/expectations of EP study and catheter ablation and he wishes to proceed. I spent over 25 minutes including 20 minutes of face to face time answering his spouses extensive list of questions and describing the procedure. Mikle Bosworth.D.

## 2019-06-16 ENCOUNTER — Ambulatory Visit: Payer: Federal, State, Local not specified - PPO | Admitting: Internal Medicine

## 2019-06-16 LAB — BASIC METABOLIC PANEL
BUN/Creatinine Ratio: 14 (ref 10–24)
BUN: 13 mg/dL (ref 8–27)
CO2: 23 mmol/L (ref 20–29)
Calcium: 9.2 mg/dL (ref 8.6–10.2)
Chloride: 101 mmol/L (ref 96–106)
Creatinine, Ser: 0.96 mg/dL (ref 0.76–1.27)
GFR calc Af Amer: 99 mL/min/{1.73_m2} (ref >59)
GFR calc non Af Amer: 86 mL/min/{1.73_m2} (ref >59)
Glucose: 237 mg/dL — ABNORMAL HIGH (ref 65–99)
Potassium: 4.2 mmol/L (ref 3.5–5.2)
Sodium: 137 mmol/L (ref 134–144)

## 2019-06-16 LAB — CBC WITH DIFFERENTIAL/PLATELET
Basophils Absolute: 0.2 10*3/uL (ref 0.0–0.2)
Basos: 2 %
EOS (ABSOLUTE): 0.6 10*3/uL — ABNORMAL HIGH (ref 0.0–0.4)
Eos: 6 %
Hematocrit: 38.5 % (ref 37.5–51.0)
Hemoglobin: 14.2 g/dL (ref 13.0–17.7)
Immature Grans (Abs): 0 10*3/uL (ref 0.0–0.1)
Immature Granulocytes: 0 %
Lymphocytes Absolute: 3.3 10*3/uL — ABNORMAL HIGH (ref 0.7–3.1)
Lymphs: 35 %
MCH: 37.1 pg — ABNORMAL HIGH (ref 26.6–33.0)
MCHC: 36.9 g/dL — ABNORMAL HIGH (ref 31.5–35.7)
MCV: 101 fL — ABNORMAL HIGH (ref 79–97)
Monocytes Absolute: 1.1 10*3/uL — ABNORMAL HIGH (ref 0.1–0.9)
Monocytes: 11 %
Neutrophils Absolute: 4.4 10*3/uL (ref 1.4–7.0)
Neutrophils: 46 %
Platelets: 319 10*3/uL (ref 150–450)
RBC: 3.83 x10E6/uL — ABNORMAL LOW (ref 4.14–5.80)
RDW: 12.8 % (ref 11.6–15.4)
WBC: 9.5 10*3/uL (ref 3.4–10.8)

## 2019-06-17 ENCOUNTER — Other Ambulatory Visit (HOSPITAL_COMMUNITY)
Admission: RE | Admit: 2019-06-17 | Discharge: 2019-06-17 | Disposition: A | Discharge status: Home / Self Care | Payer: Federal, State, Local not specified - PPO | Source: Ambulatory Visit | Attending: Internal Medicine | Admitting: Internal Medicine

## 2019-06-17 DIAGNOSIS — Z1159 Encounter for screening for other viral diseases: Secondary | ICD-10-CM | POA: Diagnosis not present

## 2019-06-17 DIAGNOSIS — Z01812 Encounter for preprocedural laboratory examination: Secondary | ICD-10-CM | POA: Insufficient documentation

## 2019-06-17 LAB — SARS CORONAVIRUS 2 (TAT 6-24 HRS): SARS Coronavirus 2: NEGATIVE

## 2019-06-19 ENCOUNTER — Encounter (HOSPITAL_COMMUNITY)
Admission: RE | Disposition: A | Discharge status: Home / Self Care | Payer: Self-pay | Source: Home / Self Care | Attending: Internal Medicine

## 2019-06-19 ENCOUNTER — Ambulatory Visit (HOSPITAL_COMMUNITY)
Admission: RE | Admit: 2019-06-19 | Discharge: 2019-06-19 | Disposition: A | Discharge status: Home / Self Care | Payer: Federal, State, Local not specified - PPO | Attending: Internal Medicine | Admitting: Internal Medicine

## 2019-06-19 ENCOUNTER — Other Ambulatory Visit: Payer: Self-pay

## 2019-06-19 DIAGNOSIS — K219 Gastro-esophageal reflux disease without esophagitis: Secondary | ICD-10-CM | POA: Insufficient documentation

## 2019-06-19 DIAGNOSIS — E785 Hyperlipidemia, unspecified: Secondary | ICD-10-CM | POA: Diagnosis not present

## 2019-06-19 DIAGNOSIS — Z7982 Long term (current) use of aspirin: Secondary | ICD-10-CM | POA: Diagnosis not present

## 2019-06-19 DIAGNOSIS — Z8673 Personal history of transient ischemic attack (TIA), and cerebral infarction without residual deficits: Secondary | ICD-10-CM | POA: Insufficient documentation

## 2019-06-19 DIAGNOSIS — I471 Supraventricular tachycardia: Secondary | ICD-10-CM | POA: Insufficient documentation

## 2019-06-19 DIAGNOSIS — I10 Essential (primary) hypertension: Secondary | ICD-10-CM | POA: Diagnosis not present

## 2019-06-19 DIAGNOSIS — Z79899 Other long term (current) drug therapy: Secondary | ICD-10-CM | POA: Diagnosis not present

## 2019-06-19 DIAGNOSIS — Z7984 Long term (current) use of oral hypoglycemic drugs: Secondary | ICD-10-CM | POA: Diagnosis not present

## 2019-06-19 DIAGNOSIS — Z8249 Family history of ischemic heart disease and other diseases of the circulatory system: Secondary | ICD-10-CM | POA: Diagnosis not present

## 2019-06-19 DIAGNOSIS — E119 Type 2 diabetes mellitus without complications: Secondary | ICD-10-CM | POA: Insufficient documentation

## 2019-06-19 HISTORY — PX: SVT ABLATION: EP1225

## 2019-06-19 LAB — GLUCOSE, CAPILLARY
Glucose-Capillary: 161 mg/dL — ABNORMAL HIGH (ref 70–99)
Glucose-Capillary: 184 mg/dL — ABNORMAL HIGH (ref 70–99)

## 2019-06-19 SURGERY — SVT ABLATION

## 2019-06-19 MED ORDER — MIDAZOLAM HCL 5 MG/5ML IJ SOLN
INTRAMUSCULAR | Status: AC
  Filled 2019-06-19: qty 5

## 2019-06-19 MED ORDER — HEPARIN (PORCINE) IN NACL 1000-0.9 UT/500ML-% IV SOLN
INTRAVENOUS | Status: DC | PRN
  Administered 2019-06-19: 500 mL

## 2019-06-19 MED ORDER — SODIUM CHLORIDE 0.9% FLUSH: 3.0000 mL | INTRAVENOUS | Status: DC | PRN

## 2019-06-19 MED ORDER — FENTANYL CITRATE (PF) 100 MCG/2ML IJ SOLN
INTRAMUSCULAR | Status: DC | PRN
  Administered 2019-06-19: 12.5 ug via INTRAVENOUS
  Administered 2019-06-19: 25 ug via INTRAVENOUS
  Administered 2019-06-19 (×2): 12.5 ug via INTRAVENOUS

## 2019-06-19 MED ORDER — SODIUM CHLORIDE 0.9% FLUSH: 3.0000 mL | Freq: Two times a day (BID) | INTRAVENOUS | Status: DC

## 2019-06-19 MED ORDER — SODIUM CHLORIDE 0.9 % IV SOLN
INTRAVENOUS | Status: DC
  Administered 2019-06-19: 06:00:00 via INTRAVENOUS

## 2019-06-19 MED ORDER — HEPARIN (PORCINE) IN NACL 1000-0.9 UT/500ML-% IV SOLN
INTRAVENOUS | Status: AC
  Filled 2019-06-19: qty 1000

## 2019-06-19 MED ORDER — ISOPROTERENOL HCL 0.2 MG/ML IJ SOLN
INTRAVENOUS | Status: DC | PRN
  Administered 2019-06-19: 4 ug/min via INTRAVENOUS

## 2019-06-19 MED ORDER — ISOPROTERENOL HCL 0.2 MG/ML IJ SOLN
INTRAMUSCULAR | Status: AC
  Filled 2019-06-19: qty 5

## 2019-06-19 MED ORDER — ONDANSETRON HCL 4 MG/2ML IJ SOLN: 4.0000 mg | Freq: Four times a day (QID) | INTRAMUSCULAR | Status: DC | PRN

## 2019-06-19 MED ORDER — BUPIVACAINE HCL (PF) 0.25 % IJ SOLN
INTRAMUSCULAR | Status: DC | PRN
  Administered 2019-06-19: 45 mL

## 2019-06-19 MED ORDER — MIDAZOLAM HCL 5 MG/5ML IJ SOLN
INTRAMUSCULAR | Status: DC | PRN
  Administered 2019-06-19 (×4): 1 mg via INTRAVENOUS

## 2019-06-19 MED ORDER — FENTANYL CITRATE (PF) 100 MCG/2ML IJ SOLN
INTRAMUSCULAR | Status: AC
  Filled 2019-06-19: qty 2

## 2019-06-19 MED ORDER — SODIUM CHLORIDE 0.9 % IV SOLN: 250.0000 mL | INTRAVENOUS | Status: DC | PRN

## 2019-06-19 MED ORDER — ACETAMINOPHEN 325 MG PO TABS
650.0000 mg | ORAL_TABLET | ORAL | Status: DC | PRN
  Filled 2019-06-19: qty 2

## 2019-06-19 SURGICAL SUPPLY — 13 items
BAG SNAP BAND KOVER 36X36 (MISCELLANEOUS) ×2 IMPLANT
CATH CELSIUS THERMO F CV 7FR (ABLATOR) ×2 IMPLANT
CATH HEX JOSEPH 2-5-2 65CM 6F (CATHETERS) ×2 IMPLANT
CATH JOSEPH QUAD ALLRED 6F REP (CATHETERS) ×4 IMPLANT
CATH QUAD COURNAND 5FR REPROC (CATHETERS) IMPLANT
CATH QUAD JOSEPHSON 5FR REPROC (CATHETERS) IMPLANT
PACK EP LATEX FREE (CUSTOM PROCEDURE TRAY) ×1
PACK EP LF (CUSTOM PROCEDURE TRAY) ×1 IMPLANT
PAD PRO RADIOLUCENT 2001M-C (PAD) ×2 IMPLANT
SHEATH PINNACLE 6F 10CM (SHEATH) ×4 IMPLANT
SHEATH PINNACLE 7F 10CM (SHEATH) ×2 IMPLANT
SHEATH PINNACLE 8F 10CM (SHEATH) ×2 IMPLANT
SHIELD RADPAD SCOOP 12X17 (MISCELLANEOUS) ×2 IMPLANT

## 2019-06-19 NOTE — H&P (Signed)
HPI Kurt Lloyd returns today for ongoing evaluation and management of his SVT. I saw him 4 months ago for palpitations and he was noted to have SVT at 160/min. The episodes would stop and start suddenly. He had some improvement when he reduced his caffeine intake. He was scheduled to undergo catheter ablation in March but this was cancelled due to the Covid pandemic. In the interim, he has done fairly well with recurrent palpitations. He is able to stop these with valsalva.   No Known Allergies         Current Outpatient Medications  Medication Sig Dispense Refill  . aspirin EC 81 MG tablet Take 81 mg by mouth daily.    Marland Kitchen EPINEPHrine 0.3 mg/0.3 mL IJ SOAJ injection Inject 0.3 mg into the muscle as needed for anaphylaxis (allergy shot).     . fexofenadine (ALLEGRA) 180 MG tablet Take 180 mg by mouth daily.    . fluticasone (FLONASE) 50 MCG/ACT nasal spray Place 1 spray into both nostrils daily.    . metFORMIN (GLUCOPHAGE) 500 MG tablet Take 1,000 mg by mouth 2 (two) times a day.     . Olopatadine HCl 0.6 % SOLN Place 2 sprays into the nose 2 (two) times daily.    Marland Kitchen OVER THE COUNTER MEDICATION Inject 1 Dose as directed once a week. Allergy Shots    . Polyethyl Glycol-Propyl Glycol (SYSTANE) 0.4-0.3 % SOLN Place 1 drop into both eyes 3 (three) times daily as needed (dry/irritated eyes.).    Marland Kitchen rosuvastatin (CRESTOR) 40 MG tablet Take 40 mg by mouth daily.     No current facility-administered medications for this visit.          Past Medical History:  Diagnosis Date  . Allergy   . Blood transfusion   . Diabetes mellitus   . GERD (gastroesophageal reflux disease)   . Heart murmur   . Hereditary spherocytosis ([place])   . Hyperlipidemia   . Hypertension   . Stroke [facility])    x2 2007    ROS:   All systems reviewed and negative except as noted in the HPI.        Past Surgical History:  Procedure Laterality Date  . COLONOSCOPY    .  SPLENECTOMY    . VASECTOMY            Family History  Problem Relation Age of Onset  . Stroke Mother   . Lung cancer Father   . Hypertension Father   . Colon cancer Neg Hx   . Esophageal cancer Neg Hx   . Rectal cancer Neg Hx   . Stomach cancer Neg Hx      Social History        Socioeconomic History  . Marital status: Married    Spouse name: Not on file  . Number of children: Not on file  . Years of education: Not on file  . Highest education level: Not on file  Occupational History  . Not on file  Social Needs  . Financial resource strain: Not on file  . Food insecurity    Worry: Not on file    Inability: Not on file  . Transportation needs    Medical: Not on file    Non-medical: Not on file  Tobacco Use  . Smoking status: Never Smoker  . Smokeless tobacco: Never Used  Substance and Sexual Activity  . Alcohol use: Yes    Comment: occasionally  . Drug use: No  . Sexual activity:  Not on file  Lifestyle  . Physical activity    Days per week: Not on file    Minutes per session: Not on file  . Stress: Not on file  Relationships  . Social Herbalist on phone: Not on file    Gets together: Not on file    Attends religious service: Not on file    Active member of club or organization: Not on file    Attends meetings of clubs or organizations: Not on file    Relationship status: Not on file  . Intimate partner violence    Fear of current or ex partner: Not on file    Emotionally abused: Not on file    Physically abused: Not on file    Forced sexual activity: Not on file  Other Topics Concern  . Not on file  Social History Narrative  . Not on file     Ht 5\' 7"  (1.702 m)   Wt 186 lb 6.4 oz (84.6 kg)   BMI 29.19 kg/m   Physical Exam:  Well appearing NAD HEENT: Unremarkable Neck:  No JVD Lungs:  No increased work of breathing HEART:  Regular rate rhythm Abd:  Non-distended Ext:  No  edema Skin:  No rashes no nodules Neuro:  CN II through XII intact, motor grossly intact  EKG - nsr with no pre-excitation  Assess/Plan: 1. SVT - I have discussed the indications/risks/benefits/goals/expectations of EP study and catheter ablation and he wishes to proceed. I spent over 25 minutes including 20 minutes of face to face time answering his spouses extensive list of questions and describing the procedure. Mikle Bosworth.D.

## 2019-06-19 NOTE — Progress Notes (Signed)
Site area: Right IJ Site prior to removal: level 0 Pressure applied for: 5 mins Manual: yes Patient status during pull: stable Post pull site: level 0 Post pull instructions given: yes Post pull pulses present: yes Dressing applied:  Petroleum dressing, gauze, and tegaderm

## 2019-06-19 NOTE — Progress Notes (Signed)
Site area:  R femoral, 30f venous x2 and 53f venous removed by Suella Broad RN Site prior to removal: level 0 Pressure applied for: 15 mins Manual: yes Patient status during pull: stable Post pull site: level 0 Post pull instructions given: yes Post pull pulses present: yes Dressing applied: gauze and tegaderm Bedrest begins: 8921

## 2019-06-19 NOTE — Discharge Instructions (Signed)
Post procedure care instructions No driving for 4 days. No lifting over 5 lbs for 1 week. No vigorous or sexual activity for 1 week. You may return to work on 06/26/2019. Keep procedure site clean & dry. If you notice increased pain, swelling, bleeding or pus, call/return!  You may shower, but no soaking baths/hot tubs/pools for 1 week.       Cardiac Ablation, Care After This sheet gives you information about how to care for yourself after your procedure. Your health care provider may also give you more specific instructions. If you have problems or questions, contact your health care provider. What can I expect after the procedure? After the procedure, it is common to have:  Bruising around your puncture site.  Tenderness around your puncture site.  Skipped heartbeats.  Tiredness (fatigue). Follow these instructions at home: Puncture site care   Follow instructions from your health care provider about how to take care of your puncture site. Make sure you: ? Wash your hands with soap and water before you change your bandage (dressing). If soap and water are not available, use hand sanitizer. ? Remove your dressing as told by your health care provider. In 24-48 hours ? Leave stitches (sutures), skin glue, or adhesive strips in place. These skin closures may need to stay in place for up to 2 weeks. If adhesive strip edges start to loosen and curl up, you may trim the loose edges. Do not remove adhesive strips completely unless your health care provider tells you to do that.  Check your puncture site every day for signs of infection. Check for: ? Redness, swelling, or pain. ? Fluid or blood. If your puncture site starts to bleed, lie down on your back, apply firm pressure to the area, and contact your health care provider. ? Warmth. ? Pus or a bad smell. Driving  Ask your health care provider when it is safe for you to drive again after the procedure.  Do not drive or use heavy  machinery while taking prescription pain medicine.  Do not drive for 24 hours if you were given a medicine to help you relax (sedative) during your procedure. Activity  Avoid activities that take a lot of effort for at least 3 days after your procedure.  Do not lift anything that is heavier than 5 lb, or the limit that you are told, until your health care provider says that it is safe. For 5 days  Return to your normal activities as told by your health care provider. Ask your health care provider what activities are safe for you. General instructions  Take over-the-counter and prescription medicines only as told by your health care provider.  Do not use any products that contain nicotine or tobacco, such as cigarettes and e-cigarettes. If you need help quitting, ask your health care provider.  Do not take baths, swim, or use a hot tub until your health care provider approves.  Do not drink alcohol for 24 hours after your procedure.  Keep all follow-up visits as told by your health care provider. This is important. Contact a health care provider if:  You have redness, mild swelling, or pain around your puncture site.  You have fluid or blood coming from your puncture site that stops after applying firm pressure to the area.  Your puncture site feels warm to the touch.  You have pus or a bad smell coming from your puncture site.  You have a fever.  You have chest pain or  discomfort that spreads to your neck, jaw, or arm.  You are sweating a lot.  You feel nauseous.  You have a fast or irregular heartbeat.  You have shortness of breath.  You are dizzy or light-headed and feel the need to lie down.  You have pain or numbness in the arm or leg closest to your puncture site. Get help right away if:  Your puncture site suddenly swells.  Your puncture site is bleeding and the bleeding does not stop after applying firm pressure to the area. These symptoms may represent a  serious problem that is an emergency. Do not wait to see if the symptoms will go away. Get medical help right away. Call your local emergency services (911 in the [place] Do not drive yourself to the hospital. Summary  After the procedure, it is normal to have bruising and tenderness at the puncture site in your groin, neck, or forearm.  Check your puncture site every day for signs of infection.  Get help right away if your puncture site is bleeding and the bleeding does not stop after applying firm pressure to the area. This is a medical emergency. This information is not intended to replace advice given to you by your health care provider. Make sure you discuss any questions you have with your health care provider. Document Released: 03/07/2017 Document Revised: 11/08/2017 Document Reviewed: 03/07/2017 Elsevier Patient Education  2020 [employer].

## 2019-06-22 ENCOUNTER — Encounter (HOSPITAL_COMMUNITY): Payer: Self-pay | Admitting: Internal Medicine

## 2019-06-22 MED FILL — Heparin Sod (Porcine)-NaCl IV Soln 1000 Unit/500ML-0.9%: INTRAVENOUS | Qty: 500 | Status: AC

## 2019-07-31 ENCOUNTER — Ambulatory Visit: Payer: Federal, State, Local not specified - PPO | Admitting: Internal Medicine

## 2019-07-31 ENCOUNTER — Other Ambulatory Visit: Payer: Self-pay

## 2019-07-31 ENCOUNTER — Encounter: Payer: Self-pay | Admitting: Internal Medicine

## 2019-07-31 VITALS — BP 112/66 | HR 58 | Ht 67.0 in | Wt 191.0 lb

## 2019-07-31 DIAGNOSIS — I10 Essential (primary) hypertension: Secondary | ICD-10-CM

## 2019-07-31 DIAGNOSIS — R002 Palpitations: Secondary | ICD-10-CM

## 2019-07-31 DIAGNOSIS — I471 Supraventricular tachycardia: Secondary | ICD-10-CM

## 2019-07-31 NOTE — Progress Notes (Signed)
HPI Mr. Kurt Lloyd returns today for followup. He has a h/o SVT and underwent EPS/RFA of AVNRT several weeks ago. In the interim he has done well with no chest pain, sob, or edema. No palpitations.  No Known Allergies   Current Outpatient Medications  Medication Sig Dispense Refill  . aspirin EC 81 MG tablet Take 81 mg by mouth daily.    Marland Kitchen EPINEPHrine 0.3 mg/0.3 mL IJ SOAJ injection Inject 0.3 mg into the muscle as needed for anaphylaxis (allergy shot).     . fexofenadine (ALLEGRA) 180 MG tablet Take 180 mg by mouth daily.    . fluticasone (FLONASE) 50 MCG/ACT nasal spray Place 1 spray into both nostrils daily.    . metFORMIN (GLUCOPHAGE) 500 MG tablet Take 1,000 mg by mouth 2 (two) times a day.     . Olopatadine HCl 0.6 % SOLN Place 2 sprays into the nose 2 (two) times daily.    Marland Kitchen OVER THE COUNTER MEDICATION Inject 1 Dose as directed once a week. Allergy Shots    . OZEMPIC, 0.25 OR 0.5 MG/DOSE, 2 MG/1.5ML SOPN Inject 1 Dose into the skin once a week.    Vladimir Faster Glycol-Propyl Glycol (SYSTANE) 0.4-0.3 % SOLN Place 1 drop into both eyes 3 (three) times daily as needed (dry/irritated eyes.).    Marland Kitchen rosuvastatin (CRESTOR) 40 MG tablet Take 40 mg by mouth daily.     No current facility-administered medications for this visit.      Past Medical History:  Diagnosis Date  . Allergy   . Blood transfusion   . Diabetes mellitus   . GERD (gastroesophageal reflux disease)   . Heart murmur   . Hereditary spherocytosis ([place])   . Hyperlipidemia   . Hypertension   . Stroke [facility])    x2 2007    ROS:   All systems reviewed and negative except as noted in the HPI.   Past Surgical History:  Procedure Laterality Date  . COLONOSCOPY    . SPLENECTOMY    . SVT ABLATION N/A 06/19/2019   Procedure: SVT ABLATION;  Surgeon: Evans Lance, MD;  Location: [facility];  Service: Cardiovascular;  Laterality: N/A;  . VASECTOMY       Family History  Problem Relation Age of Onset   . Stroke Mother   . Lung cancer Father   . Hypertension Father   . Colon cancer Neg Hx   . Esophageal cancer Neg Hx   . Rectal cancer Neg Hx   . Stomach cancer Neg Hx      Social History   Socioeconomic History  . Marital status: Married    Spouse name: Not on file  . Number of children: Not on file  . Years of education: Not on file  . Highest education level: Not on file  Occupational History  . Not on file  Social Needs  . Financial resource strain: Not on file  . Food insecurity    Worry: Not on file    Inability: Not on file  . Transportation needs    Medical: Not on file    Non-medical: Not on file  Tobacco Use  . Smoking status: Never Smoker  . Smokeless tobacco: Never Used  Substance and Sexual Activity  . Alcohol use: Yes    Comment: occasionally  . Drug use: No  . Sexual activity: Not on file  Lifestyle  . Physical activity    Days per week: Not on file  Minutes per session: Not on file  . Stress: Not on file  Relationships  . Social Herbalist on phone: Not on file    Gets together: Not on file    Attends religious service: Not on file    Active member of club or organization: Not on file    Attends meetings of clubs or organizations: Not on file    Relationship status: Not on file  . Intimate partner violence    Fear of current or ex partner: Not on file    Emotionally abused: Not on file    Physically abused: Not on file    Forced sexual activity: Not on file  Other Topics Concern  . Not on file  Social History Narrative  . Not on file     BP 112/66   Pulse (!) 58   Ht 5\' 7"  (1.702 m)   Wt 191 lb (86.6 kg)   SpO2 98%   BMI 29.91 kg/m   Physical Exam:  Well appearing NAD HEENT: Unremarkable Neck:  No JVD, no thyromegally Lymphatics:  No adenopathy Back:  No CVA tenderness Lungs:  Clear with no wheezes HEART:  Regular rate rhythm, no murmurs, no rubs, no clicks Abd:  soft, positive bowel sounds, no organomegally, no  rebound, no guarding Ext:  2 plus pulses, no edema, no cyanosis, no clubbing Skin:  No rashes no nodules Neuro:  CN II through XII intact, motor grossly intact  EKG - nsr  Assess/Plan: 1. SVT - he is doing well, s/p EPS/RFA of AVNRT. He appears to be doing well with no recurrent symptoms.   Mikle Bosworth.D.

## 2019-07-31 NOTE — Patient Instructions (Signed)
Medication Instructions:  Your physician recommends that you continue on your current medications as directed. Please refer to the Current Medication list given to you today.  Labwork: None ordered.  Testing/Procedures: None ordered.  Follow-Up: Your physician recommends that you schedule a follow-up appointment as needed with Dr. Lovena Le  Any Other Special Instructions Will Be Listed Below (If Applicable).     If you need a refill on your cardiac medications before your next appointment, please call your pharmacy.

## 2019-08-07 ENCOUNTER — Telehealth: Payer: Self-pay | Admitting: Hematology

## 2019-08-07 NOTE — Telephone Encounter (Signed)
Received a new hem referral from Kurt Lloyd at [facility] for eval hemochromatosis. Kurt Lloyd has been cld and scheduled to see Kurt Lloyd on 9/2 at [place] been made aware to arrive 10am.

## 2019-08-22 ENCOUNTER — Inpatient Hospital Stay (HOSPITAL_COMMUNITY)
Admission: EM | Admit: 2019-08-22 | Discharge: 2019-08-24 | DRG: 184 | Disposition: A | Discharge status: Home / Self Care | Payer: Federal, State, Local not specified - PPO | Attending: General Surgery | Admitting: General Surgery

## 2019-08-22 ENCOUNTER — Emergency Department (HOSPITAL_COMMUNITY): Payer: Federal, State, Local not specified - PPO

## 2019-08-22 DIAGNOSIS — S2242XA Multiple fractures of ribs, left side, initial encounter for closed fracture: Secondary | ICD-10-CM | POA: Diagnosis present

## 2019-08-22 DIAGNOSIS — N521 Erectile dysfunction due to diseases classified elsewhere: Secondary | ICD-10-CM | POA: Diagnosis present

## 2019-08-22 DIAGNOSIS — E119 Type 2 diabetes mellitus without complications: Secondary | ICD-10-CM | POA: Diagnosis present

## 2019-08-22 DIAGNOSIS — Z823 Family history of stroke: Secondary | ICD-10-CM | POA: Diagnosis not present

## 2019-08-22 DIAGNOSIS — Z7982 Long term (current) use of aspirin: Secondary | ICD-10-CM | POA: Diagnosis not present

## 2019-08-22 DIAGNOSIS — I10 Essential (primary) hypertension: Secondary | ICD-10-CM | POA: Diagnosis present

## 2019-08-22 DIAGNOSIS — I771 Stricture of artery: Secondary | ICD-10-CM | POA: Diagnosis present

## 2019-08-22 DIAGNOSIS — S2242XS Multiple fractures of ribs, left side, sequela: Secondary | ICD-10-CM

## 2019-08-22 DIAGNOSIS — K219 Gastro-esophageal reflux disease without esophagitis: Secondary | ICD-10-CM | POA: Diagnosis present

## 2019-08-22 DIAGNOSIS — J9811 Atelectasis: Secondary | ICD-10-CM | POA: Diagnosis present

## 2019-08-22 DIAGNOSIS — E785 Hyperlipidemia, unspecified: Secondary | ICD-10-CM | POA: Diagnosis present

## 2019-08-22 DIAGNOSIS — D580 Hereditary spherocytosis: Secondary | ICD-10-CM | POA: Diagnosis present

## 2019-08-22 DIAGNOSIS — Z20828 Contact with and (suspected) exposure to other viral communicable diseases: Secondary | ICD-10-CM | POA: Diagnosis present

## 2019-08-22 DIAGNOSIS — T07XXXA Unspecified multiple injuries, initial encounter: Secondary | ICD-10-CM

## 2019-08-22 DIAGNOSIS — Z8673 Personal history of transient ischemic attack (TIA), and cerebral infarction without residual deficits: Secondary | ICD-10-CM | POA: Diagnosis not present

## 2019-08-22 DIAGNOSIS — Z79899 Other long term (current) drug therapy: Secondary | ICD-10-CM | POA: Diagnosis not present

## 2019-08-22 DIAGNOSIS — Z8249 Family history of ischemic heart disease and other diseases of the circulatory system: Secondary | ICD-10-CM

## 2019-08-22 DIAGNOSIS — J302 Other seasonal allergic rhinitis: Secondary | ICD-10-CM | POA: Diagnosis present

## 2019-08-22 DIAGNOSIS — Z9081 Acquired absence of spleen: Secondary | ICD-10-CM | POA: Diagnosis not present

## 2019-08-22 DIAGNOSIS — Z7951 Long term (current) use of inhaled steroids: Secondary | ICD-10-CM

## 2019-08-22 DIAGNOSIS — S27321A Contusion of lung, unilateral, initial encounter: Secondary | ICD-10-CM | POA: Diagnosis present

## 2019-08-22 DIAGNOSIS — S2239XA Fracture of one rib, unspecified side, initial encounter for closed fracture: Secondary | ICD-10-CM | POA: Diagnosis present

## 2019-08-22 DIAGNOSIS — S2249XA Multiple fractures of ribs, unspecified side, initial encounter for closed fracture: Secondary | ICD-10-CM | POA: Diagnosis present

## 2019-08-22 DIAGNOSIS — R911 Solitary pulmonary nodule: Secondary | ICD-10-CM | POA: Diagnosis present

## 2019-08-22 DIAGNOSIS — Z7984 Long term (current) use of oral hypoglycemic drugs: Secondary | ICD-10-CM | POA: Diagnosis not present

## 2019-08-22 LAB — CBC
HCT: 39.5 % (ref 39.0–52.0)
HCT: 42.6 % (ref 39.0–52.0)
Hemoglobin: 14.8 g/dL (ref 13.0–17.0)
Hemoglobin: 15.9 g/dL (ref 13.0–17.0)
MCH: 36.2 pg — ABNORMAL HIGH (ref 26.0–34.0)
MCH: 36 pg — ABNORMAL HIGH (ref 26.0–34.0)
MCHC: 37.5 g/dL — ABNORMAL HIGH (ref 30.0–36.0)
MCHC: 37.3 g/dL — ABNORMAL HIGH (ref 30.0–36.0)
MCV: 96.6 fL (ref 80.0–100.0)
MCV: 96.4 fL (ref 80.0–100.0)
Platelets: 321 10*3/uL (ref 150–400)
Platelets: 288 10*3/uL (ref 150–400)
RBC: 4.09 MIL/uL — ABNORMAL LOW (ref 4.22–5.81)
RBC: 4.42 MIL/uL (ref 4.22–5.81)
RDW: 13.6 % (ref 11.5–15.5)
RDW: 13.7 % (ref 11.5–15.5)
WBC: 19.3 10*3/uL — ABNORMAL HIGH (ref 4.0–10.5)
WBC: 21.2 10*3/uL — ABNORMAL HIGH (ref 4.0–10.5)
nRBC: 0.1 % (ref 0.0–0.2)
nRBC: 0.1 % (ref 0.0–0.2)

## 2019-08-22 LAB — CREATININE, SERUM
Creatinine, Ser: 1.11 mg/dL (ref 0.61–1.24)
GFR calc Af Amer: >60 mL/min (ref >60)
GFR calc non Af Amer: >60 mL/min (ref >60)

## 2019-08-22 LAB — COMPREHENSIVE METABOLIC PANEL
ALT: 33 U/L (ref 0–44)
AST: 37 U/L (ref 15–41)
Albumin: 4 g/dL (ref 3.5–5.0)
Alkaline Phosphatase: 77 U/L (ref 38–126)
Anion gap: 10 (ref 5–15)
BUN: 13 mg/dL (ref 6–20)
CO2: 24 mmol/L (ref 22–32)
Calcium: 8.9 mg/dL (ref 8.9–10.3)
Chloride: 99 mmol/L (ref 98–111)
Creatinine, Ser: 1.12 mg/dL (ref 0.61–1.24)
GFR calc Af Amer: >60 mL/min (ref >60)
GFR calc non Af Amer: >60 mL/min (ref >60)
Glucose, Bld: 354 mg/dL — ABNORMAL HIGH (ref 70–99)
Potassium: 4 mmol/L (ref 3.5–5.1)
Sodium: 133 mmol/L — ABNORMAL LOW (ref 135–145)
Total Bilirubin: 1.6 mg/dL — ABNORMAL HIGH (ref 0.3–1.2)
Total Protein: 7.2 g/dL (ref 6.5–8.1)

## 2019-08-22 LAB — I-STAT CHEM 8, ED
BUN: 15 mg/dL (ref 6–20)
Calcium, Ion: 1.14 mmol/L — ABNORMAL LOW (ref 1.15–1.40)
Chloride: 100 mmol/L (ref 98–111)
Creatinine, Ser: 1.1 mg/dL (ref 0.61–1.24)
Glucose, Bld: 350 mg/dL — ABNORMAL HIGH (ref 70–99)
HCT: 38 % — ABNORMAL LOW (ref 39.0–52.0)
Hemoglobin: 12.9 g/dL — ABNORMAL LOW (ref 13.0–17.0)
Potassium: 4.1 mmol/L (ref 3.5–5.1)
Sodium: 135 mmol/L (ref 135–145)
TCO2: 25 mmol/L (ref 22–32)

## 2019-08-22 LAB — GLUCOSE, CAPILLARY: Glucose-Capillary: 361 mg/dL — ABNORMAL HIGH (ref 70–99)

## 2019-08-22 LAB — URINALYSIS, ROUTINE W REFLEX MICROSCOPIC
Bacteria, UA: NONE SEEN
Bilirubin Urine: NEGATIVE
Glucose, UA: >=500 mg/dL — AB
Ketones, ur: NEGATIVE mg/dL
Leukocytes,Ua: NEGATIVE
Nitrite: NEGATIVE
Protein, ur: NEGATIVE mg/dL
Specific Gravity, Urine: 1.017 (ref 1.005–1.030)
pH: 5 (ref 5.0–8.0)

## 2019-08-22 LAB — PROTIME-INR
INR: 1 (ref 0.8–1.2)
Prothrombin Time: 12.7 seconds (ref 11.4–15.2)

## 2019-08-22 LAB — SAMPLE TO BLOOD BANK

## 2019-08-22 LAB — ETHANOL: Alcohol, Ethyl (B): <10 mg/dL (ref <10)

## 2019-08-22 LAB — LACTIC ACID, PLASMA: Lactic Acid, Venous: 2.5 mmol/L (ref 0.5–1.9)

## 2019-08-22 LAB — SARS CORONAVIRUS 2 BY RT PCR (HOSPITAL ORDER, PERFORMED IN ~~LOC~~ HOSPITAL LAB): SARS Coronavirus 2: NEGATIVE

## 2019-08-22 MED ORDER — IOHEXOL 300 MG/ML  SOLN
100.0000 mL | Freq: Once | INTRAMUSCULAR | Status: AC | PRN
  Administered 2019-08-22: 100 mL via INTRAVENOUS

## 2019-08-22 MED ORDER — INSULIN ASPART 100 UNIT/ML ~~LOC~~ SOLN
0.0000 [IU] | Freq: Three times a day (TID) | SUBCUTANEOUS | Status: DC
  Administered 2019-08-23 (×2): 8 [IU] via SUBCUTANEOUS
  Administered 2019-08-23 – 2019-08-24 (×2): 3 [IU] via SUBCUTANEOUS

## 2019-08-22 MED ORDER — ONDANSETRON 4 MG PO TBDP: 4.0000 mg | ORAL_TABLET | Freq: Four times a day (QID) | ORAL | Status: DC | PRN

## 2019-08-22 MED ORDER — ONDANSETRON HCL 4 MG/2ML IJ SOLN: 4.0000 mg | Freq: Four times a day (QID) | INTRAMUSCULAR | Status: DC | PRN

## 2019-08-22 MED ORDER — SODIUM CHLORIDE 0.9 % IV BOLUS
500.0000 mL | Freq: Once | INTRAVENOUS | Status: AC
  Administered 2019-08-22: 500 mL via INTRAVENOUS

## 2019-08-22 MED ORDER — OXYCODONE HCL 5 MG PO TABS: 5.0000 mg | ORAL_TABLET | ORAL | Status: DC | PRN

## 2019-08-22 MED ORDER — METOPROLOL TARTRATE 5 MG/5ML IV SOLN: 5.0000 mg | Freq: Four times a day (QID) | INTRAVENOUS | Status: DC | PRN

## 2019-08-22 MED ORDER — PANTOPRAZOLE SODIUM 40 MG PO TBEC
40.0000 mg | DELAYED_RELEASE_TABLET | Freq: Every day | ORAL | Status: DC
  Administered 2019-08-23 – 2019-08-24 (×2): 40 mg via ORAL
  Filled 2019-08-22 (×2): qty 1

## 2019-08-22 MED ORDER — DOCUSATE SODIUM 100 MG PO CAPS
100.0000 mg | ORAL_CAPSULE | Freq: Two times a day (BID) | ORAL | Status: DC
  Administered 2019-08-22 – 2019-08-24 (×4): 100 mg via ORAL
  Filled 2019-08-22 (×4): qty 1

## 2019-08-22 MED ORDER — HYDRALAZINE HCL 20 MG/ML IJ SOLN: 10.0000 mg | INTRAMUSCULAR | Status: DC | PRN

## 2019-08-22 MED ORDER — INSULIN ASPART 100 UNIT/ML ~~LOC~~ SOLN
5.0000 [IU] | Freq: Once | SUBCUTANEOUS | Status: AC
  Administered 2019-08-22: 5 [IU] via SUBCUTANEOUS

## 2019-08-22 MED ORDER — KETOROLAC TROMETHAMINE 30 MG/ML IJ SOLN
30.0000 mg | Freq: Three times a day (TID) | INTRAMUSCULAR | Status: DC | PRN
  Administered 2019-08-22 – 2019-08-23 (×2): 30 mg via INTRAVENOUS
  Filled 2019-08-22 (×2): qty 1

## 2019-08-22 MED ORDER — MORPHINE SULFATE (PF) 2 MG/ML IV SOLN: 2.0000 mg | INTRAVENOUS | Status: DC | PRN

## 2019-08-22 MED ORDER — FENTANYL CITRATE (PF) 100 MCG/2ML IJ SOLN
50.0000 ug | Freq: Once | INTRAMUSCULAR | Status: AC
  Administered 2019-08-22: 50 ug via INTRAVENOUS
  Filled 2019-08-22: qty 2

## 2019-08-22 MED ORDER — SODIUM CHLORIDE 0.9 % IV SOLN
INTRAVENOUS | Status: DC
  Administered 2019-08-22 – 2019-08-23 (×2): via INTRAVENOUS

## 2019-08-22 MED ORDER — ENOXAPARIN SODIUM 40 MG/0.4ML ~~LOC~~ SOLN
40.0000 mg | SUBCUTANEOUS | Status: DC
  Administered 2019-08-22 – 2019-08-23 (×2): 40 mg via SUBCUTANEOUS
  Filled 2019-08-22 (×2): qty 0.4

## 2019-08-22 MED ORDER — PANTOPRAZOLE SODIUM 40 MG IV SOLR
40.0000 mg | Freq: Every day | INTRAVENOUS | Status: DC
  Administered 2019-08-22: 40 mg via INTRAVENOUS
  Filled 2019-08-22 (×2): qty 40

## 2019-08-22 MED ORDER — ACETAMINOPHEN 325 MG PO TABS: 650.0000 mg | ORAL_TABLET | ORAL | Status: DC | PRN
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
## 2019-08-22 NOTE — ED Notes (Signed)
Dr. Tamera Punt advised patient may have ice chips, ice chips provided to patient.

## 2019-08-22 NOTE — Progress Notes (Signed)
Lab called critical lab value lactic acid 2.5.  On call MD notified.  Received order to give NS 500 mL bolus and telemetry to be dc'd.

## 2019-08-22 NOTE — ED Notes (Signed)
C-collar changed from EMS collar- c-spine precautions maintained
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
## 2019-08-23 ENCOUNTER — Other Ambulatory Visit: Payer: Self-pay

## 2019-08-23 ENCOUNTER — Inpatient Hospital Stay (HOSPITAL_COMMUNITY): Payer: Federal, State, Local not specified - PPO

## 2019-08-23 LAB — BASIC METABOLIC PANEL
Anion gap: 9 (ref 5–15)
BUN: 21 mg/dL — ABNORMAL HIGH (ref 6–20)
CO2: 23 mmol/L (ref 22–32)
Calcium: 8.3 mg/dL — ABNORMAL LOW (ref 8.9–10.3)
Chloride: 103 mmol/L (ref 98–111)
Creatinine, Ser: 1.04 mg/dL (ref 0.61–1.24)
GFR calc Af Amer: >60 mL/min (ref >60)
GFR calc non Af Amer: >60 mL/min (ref >60)
Glucose, Bld: 282 mg/dL — ABNORMAL HIGH (ref 70–99)
Potassium: 3.8 mmol/L (ref 3.5–5.1)
Sodium: 135 mmol/L (ref 135–145)

## 2019-08-23 LAB — CBC
HCT: 34.5 % — ABNORMAL LOW (ref 39.0–52.0)
Hemoglobin: 12.7 g/dL — ABNORMAL LOW (ref 13.0–17.0)
MCH: 36.1 pg — ABNORMAL HIGH (ref 26.0–34.0)
MCHC: 36.8 g/dL — ABNORMAL HIGH (ref 30.0–36.0)
MCV: 98 fL (ref 80.0–100.0)
Platelets: 296 10*3/uL (ref 150–400)
RBC: 3.52 MIL/uL — ABNORMAL LOW (ref 4.22–5.81)
RDW: 13.7 % (ref 11.5–15.5)
WBC: 12.6 10*3/uL — ABNORMAL HIGH (ref 4.0–10.5)
nRBC: 0 % (ref 0.0–0.2)

## 2019-08-23 LAB — GLUCOSE, CAPILLARY
Glucose-Capillary: 278 mg/dL — ABNORMAL HIGH (ref 70–99)
Glucose-Capillary: 296 mg/dL — ABNORMAL HIGH (ref 70–99)
Glucose-Capillary: 186 mg/dL — ABNORMAL HIGH (ref 70–99)
Glucose-Capillary: 196 mg/dL — ABNORMAL HIGH (ref 70–99)

## 2019-08-23 LAB — HIV ANTIBODY (ROUTINE TESTING W REFLEX): HIV Screen 4th Generation wRfx: NONREACTIVE

## 2019-08-23 MED ORDER — SEMAGLUTIDE(0.25 OR 0.5MG/DOS) 2 MG/1.5ML ~~LOC~~ SOPN: 0.2500 mg | PEN_INJECTOR | SUBCUTANEOUS | Status: DC

## 2019-08-23 MED ORDER — METFORMIN HCL 500 MG PO TABS
1000.0000 mg | ORAL_TABLET | Freq: Two times a day (BID) | ORAL | Status: DC
  Administered 2019-08-23 – 2019-08-24 (×2): 1000 mg via ORAL
  Filled 2019-08-23 (×2): qty 2

## 2019-08-23 MED ORDER — LORATADINE 10 MG PO TABS
10.0000 mg | ORAL_TABLET | Freq: Every day | ORAL | Status: DC
  Administered 2019-08-23 – 2019-08-24 (×2): 10 mg via ORAL
  Filled 2019-08-23 (×2): qty 1

## 2019-08-23 MED ORDER — ROSUVASTATIN CALCIUM 20 MG PO TABS
40.0000 mg | ORAL_TABLET | Freq: Every day | ORAL | Status: DC
  Administered 2019-08-23: 40 mg via ORAL
  Filled 2019-08-23: qty 2

## 2019-08-23 MED ORDER — POLYVINYL ALCOHOL 1.4 % OP SOLN: 1.0000 [drp] | Freq: Three times a day (TID) | OPHTHALMIC | Status: DC | PRN

## 2019-08-23 MED ORDER — METHOCARBAMOL 500 MG PO TABS
750.0000 mg | ORAL_TABLET | Freq: Four times a day (QID) | ORAL | Status: DC
  Administered 2019-08-23 – 2019-08-24 (×5): 750 mg via ORAL
  Filled 2019-08-23 (×5): qty 1

## 2019-08-23 MED ORDER — METHOCARBAMOL 500 MG PO TABS: 500.0000 mg | ORAL_TABLET | Freq: Three times a day (TID) | ORAL | Status: DC

## 2019-08-23 MED ORDER — GABAPENTIN 600 MG PO TABS
300.0000 mg | ORAL_TABLET | Freq: Two times a day (BID) | ORAL | Status: DC
  Administered 2019-08-23 – 2019-08-24 (×3): 300 mg via ORAL
  Filled 2019-08-23 (×3): qty 1

## 2019-08-23 MED ORDER — OXYCODONE HCL 5 MG PO TABS
5.0000 mg | ORAL_TABLET | ORAL | Status: DC | PRN
  Administered 2019-08-23 – 2019-08-24 (×2): 10 mg via ORAL
  Filled 2019-08-23 (×2): qty 2

## 2019-08-23 MED ORDER — LIVING WELL WITH DIABETES BOOK
Freq: Once | Status: AC
  Administered 2019-08-23: 15:00:00
  Filled 2019-08-23: qty 1

## 2019-08-23 MED ORDER — KETOROLAC TROMETHAMINE 30 MG/ML IJ SOLN: 15.0000 mg | Freq: Three times a day (TID) | INTRAMUSCULAR | Status: DC

## 2019-08-23 MED ORDER — ACETAMINOPHEN 500 MG PO TABS
1000.0000 mg | ORAL_TABLET | Freq: Three times a day (TID) | ORAL | Status: DC
  Administered 2019-08-23 – 2019-08-24 (×4): 1000 mg via ORAL
  Filled 2019-08-23 (×4): qty 2

## 2019-08-23 MED ORDER — FLUTICASONE PROPIONATE 50 MCG/ACT NA SUSP
1.0000 | Freq: Every day | NASAL | Status: DC
  Administered 2019-08-24: 1 via NASAL
  Filled 2019-08-23: qty 16

## 2019-08-23 MED ORDER — KETOROLAC TROMETHAMINE 30 MG/ML IJ SOLN: 30.0000 mg | Freq: Three times a day (TID) | INTRAMUSCULAR | Status: DC

## 2019-08-23 MED ORDER — ASPIRIN EC 81 MG PO TBEC
81.0000 mg | DELAYED_RELEASE_TABLET | Freq: Every day | ORAL | Status: DC
  Administered 2019-08-23 – 2019-08-24 (×2): 81 mg via ORAL
  Filled 2019-08-23 (×2): qty 1

## 2019-08-23 MED ORDER — INFLUENZA VAC SPLIT QUAD 0.5 ML IM SUSY
0.5000 mL | PREFILLED_SYRINGE | INTRAMUSCULAR | Status: DC
  Filled 2019-08-23: qty 0.5
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
## 2019-08-23 NOTE — Progress Notes (Signed)
Inpatient Diabetes Program Recommendations  AACE/ADA: New Consensus Statement on Inpatient Glycemic Control (2015)  Target Ranges:  Prepandial:   less than 140 mg/dL      Peak postprandial:   less than 180 mg/dL (1-2 hours)      Critically ill patients:  140 - 180 mg/dL   Lab Results  Component Value Date   GLUCAP 296 (H) 08/23/2019    Review of Glycemic Control  Diabetes history: DM2 Outpatient Diabetes medications: Metformin 500 mg qd + Ozempic weekly Current orders for Inpatient glycemic control: Metformin 500 qd + Novolog moderate correction tid  Inpatient Diabetes Program Recommendations:   -Add Lantus 16 units daily (0.2 units/kg x 78.5 kg) -Add Novolog 0-5 hs to correction scale Noted pending A1c.  Thank you, Nani Gasser. Alexi Dorminey, RN, MSN, CDE  Diabetes Coordinator Inpatient Glycemic Control Team Team Pager [phone] (8am-5pm) 08/23/2019 2:25 PM
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
## 2019-08-23 NOTE — Plan of Care (Signed)
  Problem: Health Behavior/Discharge Planning: Goal: Ability to manage health-related needs will improve Outcome: Progressing   

## 2019-08-24 ENCOUNTER — Inpatient Hospital Stay (HOSPITAL_COMMUNITY): Payer: Federal, State, Local not specified - PPO

## 2019-08-24 LAB — BASIC METABOLIC PANEL
Anion gap: 7 (ref 5–15)
BUN: 16 mg/dL (ref 6–20)
CO2: 23 mmol/L (ref 22–32)
Calcium: 7.9 mg/dL — ABNORMAL LOW (ref 8.9–10.3)
Chloride: 105 mmol/L (ref 98–111)
Creatinine, Ser: 1.01 mg/dL (ref 0.61–1.24)
GFR calc Af Amer: >60 mL/min (ref >60)
GFR calc non Af Amer: >60 mL/min (ref >60)
Glucose, Bld: 224 mg/dL — ABNORMAL HIGH (ref 70–99)
Potassium: 4 mmol/L (ref 3.5–5.1)
Sodium: 135 mmol/L (ref 135–145)

## 2019-08-24 LAB — GLUCOSE, CAPILLARY: Glucose-Capillary: 193 mg/dL — ABNORMAL HIGH (ref 70–99)

## 2019-08-24 LAB — HEMOGLOBIN A1C
Hgb A1c MFr Bld: 6.7 % — ABNORMAL HIGH (ref 4.8–5.6)
Mean Plasma Glucose: 146 mg/dL

## 2019-08-24 MED ORDER — ACETAMINOPHEN 500 MG PO TABS: 1000.0000 mg | ORAL_TABLET | Freq: Three times a day (TID) | ORAL | 0 refills | Status: DC

## 2019-08-24 MED ORDER — METHOCARBAMOL 750 MG PO TABS: 750.0000 mg | ORAL_TABLET | Freq: Four times a day (QID) | ORAL | 0 refills | Status: DC | PRN

## 2019-08-24 MED ORDER — OXYCODONE HCL 5 MG PO TABS: 5.0000 mg | ORAL_TABLET | ORAL | 0 refills | Status: DC | PRN

## 2019-08-24 MED ORDER — GABAPENTIN 600 MG PO TABS: 300.0000 mg | ORAL_TABLET | Freq: Two times a day (BID) | ORAL | 0 refills | Status: DC | PRN

## 2019-08-24 NOTE — Progress Notes (Signed)
Pt was brought to the Discharge Hospitality unit, Wife at bedside. RN gave pt and wife discharge instructions and answered all their questions they had. Wife packed all belongings and helped pt get dressed. 3n1 has been delivered, pt prescriptions have been escribed to the patients home pharmacy.

## 2019-08-24 NOTE — Progress Notes (Signed)
Inpatient Diabetes Program Recommendations  AACE/ADA: New Consensus Statement on Inpatient Glycemic Control (2015)  Target Ranges:  Prepandial:   less than 140 mg/dL      Peak postprandial:   less than 180 mg/dL (1-2 hours)      Critically ill patients:  140 - 180 mg/dL   Lab Results  Component Value Date   GLUCAP 193 (H) 08/24/2019    Review of Glycemic Control  Inpatient Diabetes Program Recommendations:   Spoke with patient via phone regarding diabetes management (DM coordinator working remotely). Patient states his CBGs have been >200 @ home although A1c is not completed from lab test. Patient states he has not been eating and drinking according to what he should be doing and plans to improve. Requested patient to check CBGs and report to MD appointments and review diabetes management. Answered questions and patient appreciated call and information.  Thank you, Nani Gasser. Nicolae Vasek, RN, MSN, CDE  Diabetes Coordinator Inpatient Glycemic Control Team Team Pager [phone] (8am-5pm) 08/24/2019 11:29 AM
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
## 2019-08-24 NOTE — TOC Transition Note (Signed)
Transition of Care [facility]) - CM/SW Discharge Note   Patient Details  Name: Kurt Lloyd MRN: [id number] Date of Birth: [birth year]  Transition of [facility]) CM/SW Contact:  Alexander Mt, [place] Phone Number: 08/24/2019, 9:56 AM   Clinical Narrative:    CSW met with pt at bedside. Introduced self, role, reason for visit. Pt from home with his wife; he expresses no complaints other than being sore. He is looking forward to going home and says church members are bringing him and his wife dinner this evening. Pt in agreement with recommendation for 3 in 1 but does not think he needs any further equipment.   Pt denies any concerns with ETOH use stating "I had a few [employer] but that's it." No further needs identified. Equipment order placed and requested pt remain until his equipment is delivered.    Final next level of care: Home/Self Care Barriers to Discharge: No Barriers Identified   Patient Goals and CMS Choice Patient states their goals for this hospitalization and ongoing recovery are:: to go home and be less sore CMS Medicare.gov Compare Post Acute Care list provided to:: (n/a) Choice offered to / list presented to : Patient  Discharge Placement  Home/Self Care with wife transporting him home.   Discharge Plan and Services  DME Arranged: 3-N-1 DME Agency: [employer] Date DME Agency Contacted: 08/24/19 Time DME Agency Contacted: [phone] Representative spoke with at DME Agency: [place] Arranged: NA  Social Determinants of Health ([place]) Interventions     Readmission Risk Interventions No flowsheet data found.
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
## 2019-08-24 NOTE — Discharge Instructions (Signed)
Do not resume Metformin until 9/15  Rib Fracture  A rib fracture is a break or crack in one of the bones of the ribs. The ribs are like a cage that goes around your upper chest. A broken or cracked rib is often painful, but most do not cause other problems. Most rib fractures usually heal on their own in 1-3 months. Follow these instructions at home: Managing pain, stiffness, and swelling  If directed, apply ice to the injured area. ? Put ice in a plastic bag. ? Place a towel between your skin and the bag. ? Leave the ice on for 20 minutes, 2-3 times a day.  Take over-the-counter and prescription medicines only as told by your doctor. Activity  Avoid activities that cause pain to the injured area. Protect your injured area.  Slowly increase activity as told by your doctor. General instructions  Do deep breathing as told by your doctor. You may be told to: ? Take deep breaths many times a day. ? Cough many times a day while hugging a pillow. ? Use a device (incentive spirometer) to do deep breathing many times a day.  Drink enough fluid to keep your pee (urine) clear or pale yellow.  Do not wear a rib belt or binder. These do not allow you to breathe deeply.  Keep all follow-up visits as told by your doctor. This is important. Contact a doctor if:  You have a fever. Get help right away if:  You have trouble breathing.  You are short of breath.  You cannot stop coughing.  You cough up thick or bloody spit (sputum).  You feel sick to your stomach (nauseous), throw up (vomit), or have belly (abdominal) pain.  Your pain gets worse and medicine does not help. Summary  A rib fracture is a break or crack in one of the bones of the ribs.  Apply ice to the injured area and take medicines for pain as told by your doctor.  Take deep breaths and cough many times a day. Hug a pillow every time you cough. This information is not intended to replace advice given to you by your  health care provider. Make sure you discuss any questions you have with your health care provider. Document Released: 09/04/2008 Document Revised: 11/08/2017 Document Reviewed: 02/26/2017 Elsevier Patient Education  2020 [place].   Pulmonary Nodule A pulmonary nodule is tissue that has grown on your lung. A nodule may be cancer, but most nodules are not cancer. Follow these instructions at home:   Take over-the-counter and prescription medicines only as told by your doctor.  Do not use any products that have nicotine or tobacco, such as cigarettes and e-cigarettes. If you need help quitting, ask your doctor.  Keep all follow-up visits as told by your doctor. This is important. Contact a doctor if:  You have trouble breathing when doing activities.  You feel sick.  You feel more tired than normal.  You do not feel like eating.  You lose weight without trying.  You have chills.  You have night sweats. Get help right away if:  You cannot catch your breath.  You start making whistling sounds when breathing (wheezing).  You cannot stop coughing.  You cough up blood.  You get dizzy.  You feel like you are going to pass out (faint).  You have sudden chest pain.  You have a fever or symptoms for more than 2-3 days.  You have a fever and your symptoms suddenly  get worse. Summary  A pulmonary nodule is tissue that has grown on your lung.  Most nodules are not cancer.  Your doctor will do tests to know what kind of nodule you have, and whether you need treatment for it. This information is not intended to replace advice given to you by your health care provider. Make sure you discuss any questions you have with your health care provider. Document Released: 12/29/2010 Document Revised: 12/20/2017 Document Reviewed: 12/25/2016 Elsevier Patient Education  2020 [employer].

## 2019-09-03 ENCOUNTER — Telehealth: Payer: Self-pay | Admitting: Hematology

## 2019-09-03 ENCOUNTER — Encounter: Payer: Self-pay | Admitting: Hematology

## 2019-09-03 ENCOUNTER — Inpatient Hospital Stay: Payer: Federal, State, Local not specified - PPO | Attending: Hematology | Admitting: Hematology
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
## 2019-09-30 ENCOUNTER — Telehealth: Payer: Self-pay | Admitting: Hematology

## 2019-09-30 ENCOUNTER — Inpatient Hospital Stay: Payer: Federal, State, Local not specified - PPO | Attending: Hematology | Admitting: Hematology

## 2019-09-30 ENCOUNTER — Inpatient Hospital Stay: Payer: Federal, State, Local not specified - PPO

## 2019-09-30 ENCOUNTER — Other Ambulatory Visit: Payer: Self-pay

## 2019-09-30 DIAGNOSIS — I10 Essential (primary) hypertension: Secondary | ICD-10-CM | POA: Insufficient documentation

## 2019-09-30 DIAGNOSIS — E119 Type 2 diabetes mellitus without complications: Secondary | ICD-10-CM | POA: Diagnosis not present

## 2019-09-30 LAB — CBC WITH DIFFERENTIAL/PLATELET
Abs Immature Granulocytes: 0.04 10*3/uL (ref 0.00–0.07)
Basophils Absolute: 0.1 10*3/uL (ref 0.0–0.1)
Basophils Relative: 1 %
Eosinophils Absolute: 0.2 10*3/uL (ref 0.0–0.5)
Eosinophils Relative: 2 %
HCT: 37.9 % — ABNORMAL LOW (ref 39.0–52.0)
Hemoglobin: 14.2 g/dL (ref 13.0–17.0)
Immature Granulocytes: 0 %
Lymphocytes Relative: 35 %
Lymphs Abs: 3.4 10*3/uL (ref 0.7–4.0)
MCH: 35.5 pg — ABNORMAL HIGH (ref 26.0–34.0)
MCHC: 37.5 g/dL — ABNORMAL HIGH (ref 30.0–36.0)
MCV: 94.8 fL (ref 80.0–100.0)
Monocytes Absolute: 0.7 10*3/uL (ref 0.1–1.0)
Monocytes Relative: 8 %
Neutro Abs: 5.2 10*3/uL (ref 1.7–7.7)
Neutrophils Relative %: 54 %
Platelets: 397 10*3/uL (ref 150–400)
RBC: 4 MIL/uL — ABNORMAL LOW (ref 4.22–5.81)
RDW: 13.2 % (ref 11.5–15.5)
WBC: 9.8 10*3/uL (ref 4.0–10.5)
nRBC: 0 % (ref 0.0–0.2)

## 2019-09-30 LAB — IRON AND TIBC
Iron: 109 ug/dL (ref 42–163)
Saturation Ratios: 40 % (ref 20–55)
TIBC: 271 ug/dL (ref 202–409)
UIBC: 163 ug/dL (ref 117–376)

## 2019-09-30 LAB — CMP (CANCER CENTER ONLY)
ALT: 17 U/L (ref 0–44)
AST: 16 U/L (ref 15–41)
Albumin: 4.4 g/dL (ref 3.5–5.0)
Alkaline Phosphatase: 95 U/L (ref 38–126)
Anion gap: 12 (ref 5–15)
BUN: 10 mg/dL (ref 6–20)
CO2: 24 mmol/L (ref 22–32)
Calcium: 9.7 mg/dL (ref 8.9–10.3)
Chloride: 107 mmol/L (ref 98–111)
Creatinine: 0.97 mg/dL (ref 0.61–1.24)
GFR, Est AFR Am: >60 mL/min (ref >60)
GFR, Estimated: >60 mL/min (ref >60)
Glucose, Bld: 112 mg/dL — ABNORMAL HIGH (ref 70–99)
Potassium: 4.9 mmol/L (ref 3.5–5.1)
Sodium: 143 mmol/L (ref 135–145)
Total Bilirubin: 1.3 mg/dL — ABNORMAL HIGH (ref 0.3–1.2)
Total Protein: 7.5 g/dL (ref 6.5–8.1)

## 2019-09-30 LAB — FERRITIN: Ferritin: 1361 ng/mL — ABNORMAL HIGH (ref 24–336)
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
## 2019-09-30 NOTE — Telephone Encounter (Signed)
Gave patient avs report and appointments for October thru January.

## 2019-10-08 ENCOUNTER — Inpatient Hospital Stay: Payer: Federal, State, Local not specified - PPO

## 2019-10-08 ENCOUNTER — Other Ambulatory Visit: Payer: Self-pay

## 2019-10-08 LAB — CBC WITH DIFFERENTIAL/PLATELET
Abs Immature Granulocytes: 0.03 10*3/uL (ref 0.00–0.07)
Basophils Absolute: 0.1 10*3/uL (ref 0.0–0.1)
Basophils Relative: 1 %
Eosinophils Absolute: 0.4 10*3/uL (ref 0.0–0.5)
Eosinophils Relative: 4 %
HCT: 35.3 % — ABNORMAL LOW (ref 39.0–52.0)
Hemoglobin: 13.3 g/dL (ref 13.0–17.0)
Immature Granulocytes: 0 %
Lymphocytes Relative: 36 %
Lymphs Abs: 3.3 10*3/uL (ref 0.7–4.0)
MCH: 36.2 pg — ABNORMAL HIGH (ref 26.0–34.0)
MCHC: 37.7 g/dL — ABNORMAL HIGH (ref 30.0–36.0)
MCV: 96.2 fL (ref 80.0–100.0)
Monocytes Absolute: 0.9 10*3/uL (ref 0.1–1.0)
Monocytes Relative: 10 %
Neutro Abs: 4.5 10*3/uL (ref 1.7–7.7)
Neutrophils Relative %: 49 %
Platelets: 403 10*3/uL — ABNORMAL HIGH (ref 150–400)
RBC: 3.67 MIL/uL — ABNORMAL LOW (ref 4.22–5.81)
RDW: 13.2 % (ref 11.5–15.5)
WBC: 9.2 10*3/uL (ref 4.0–10.5)
nRBC: 0 % (ref 0.0–0.2)

## 2019-10-08 LAB — FERRITIN: Ferritin: 1097 ng/mL — ABNORMAL HIGH (ref 24–336)

## 2019-10-08 NOTE — Patient Instructions (Signed)
Therapeutic Phlebotomy Discharge Instructions  - Increase your fluid intake over the next 4 hours  - No smoking for 30 minutes  - Avoid using the affected arm (the one you had the blood drawn from) for heavy lifting or other activities.  - You may resume all normal activities after 30 minutes.  You are to notify the office if you experience:   - Persistent dizziness and/or lightheadedness -Uncontrolled or excessive bleeding at the site.     Therapeutic Phlebotomy, Care After This sheet gives you information about how to care for yourself after your procedure. Your health care provider may also give you more specific instructions. If you have problems or questions, contact your health care provider. What can I expect after the procedure? After the procedure, it is common to have:  Light-headedness or dizziness. You may feel faint.  Nausea.  Tiredness (fatigue). Follow these instructions at home: Eating and drinking  Be sure to eat well-balanced meals for the next 24 hours.  Drink enough fluid to keep your urine pale yellow.  Avoid drinking alcohol on the day that you had the procedure. Activity   Return to your normal activities as told by your health care provider. Most people can go back to their normal activities right away.  Avoid activities that take a lot of effort for about 5 hours after the procedure. Athletes should avoid strenuous exercise for at least 12 hours.  Avoid heavy lifting or pulling for about 5 hours after the procedure. Do not lift anything that is heavier than 10 lb (4.5 kg).  Change positions slowly for the remainder of the day. This will help to prevent light-headedness or fainting.  If you feel light-headed, lie down until the feeling goes away. Needle insertion site care   Keep your bandage (dressing) dry. You can remove the bandage after about 5 hours or as told by your health care provider.  If you have bleeding from the needle insertion  site, raise (elevate) your arm and press firmly on the site until the bleeding stops.  If you have bruising at the site, apply ice to the area: ? Remove the dressing. ? Put ice in a plastic bag. ? Place a towel between your skin and the bag. ? Leave the ice on for 20 minutes, 2-3 times a day for the first 24 hours.  If the swelling does not go away after 24 hours, apply a warm, moist cloth (warm compress) to the area for 20 minutes, 2-3 times a day. General instructions  Do not use any products that contain nicotine or tobacco, such as cigarettes and e-cigarettes, for at least 30 minutes after the procedure.  Keep all follow-up visits as told by your health care provider. This is important. You may need to continue having regular therapeutic phlebotomy treatments as directed. Contact a health care provider if you:  Have redness, swelling, or pain at the needle insertion site.  Have fluid or blood coming from the needle insertion site.  Have pus or a bad smell coming from the needle insertion site.  Notice that the needle insertion site feels warm to the touch.  Feel light-headed, dizzy, or nauseous, and the feeling does not go away.  Have new bruising at the needle insertion site.  Feel weaker than normal.  Have a fever or chills. Get help right away if:  You faint.  You have chest pain.  You have trouble breathing.  You have severe nausea or vomiting. Summary  After the  procedure, it is common to have some light-headedness, dizziness, nausea, or tiredness (fatigue).  Be sure to eat well-balanced meals for the next 24 hours. Drink enough fluid to keep your urine pale yellow.  Return to your normal activities as told by your health care provider.  Keep all follow-up visits as told by your health care provider. You may need to continue having regular therapeutic phlebotomy treatments as directed. This information is not intended to replace advice given to you by your  health care provider. Make sure you discuss any questions you have with your health care provider. Document Released: 04/30/2011 Document Revised: 12/13/2017 Document Reviewed: 12/12/2017 Elsevier Patient Education  2020 [employer].

## 2019-10-09 ENCOUNTER — Other Ambulatory Visit: Payer: Self-pay | Admitting: Internal Medicine

## 2019-10-09 DIAGNOSIS — R221 Localized swelling, mass and lump, neck: Secondary | ICD-10-CM

## 2019-10-15 ENCOUNTER — Other Ambulatory Visit: Payer: Self-pay

## 2019-10-15 ENCOUNTER — Ambulatory Visit
Admission: RE | Admit: 2019-10-15 | Discharge: 2019-10-15 | Disposition: A | Discharge status: Home / Self Care | Payer: Federal, State, Local not specified - PPO | Source: Ambulatory Visit | Attending: Internal Medicine | Admitting: Internal Medicine

## 2019-10-15 DIAGNOSIS — R221 Localized swelling, mass and lump, neck: Secondary | ICD-10-CM

## 2019-10-15 MED ORDER — IOPAMIDOL (ISOVUE-300) INJECTION 61%
75.0000 mL | Freq: Once | INTRAVENOUS | Status: AC | PRN
  Administered 2019-10-15: 75 mL via INTRAVENOUS

## 2019-11-06 ENCOUNTER — Inpatient Hospital Stay: Payer: Federal, State, Local not specified - PPO

## 2019-11-06 ENCOUNTER — Other Ambulatory Visit: Payer: Self-pay

## 2019-11-06 ENCOUNTER — Inpatient Hospital Stay: Payer: Federal, State, Local not specified - PPO | Attending: Hematology

## 2019-11-06 LAB — CBC WITH DIFFERENTIAL/PLATELET
Abs Immature Granulocytes: 0.02 10*3/uL (ref 0.00–0.07)
Basophils Absolute: 0.2 10*3/uL — ABNORMAL HIGH (ref 0.0–0.1)
Basophils Relative: 2 %
Eosinophils Absolute: 0.4 10*3/uL (ref 0.0–0.5)
Eosinophils Relative: 5 %
HCT: 37.7 % — ABNORMAL LOW (ref 39.0–52.0)
Hemoglobin: 14.1 g/dL (ref 13.0–17.0)
Immature Granulocytes: 0 %
Lymphocytes Relative: 40 %
Lymphs Abs: 3.8 10*3/uL (ref 0.7–4.0)
MCH: 36.4 pg — ABNORMAL HIGH (ref 26.0–34.0)
MCHC: 37.4 g/dL — ABNORMAL HIGH (ref 30.0–36.0)
MCV: 97.4 fL (ref 80.0–100.0)
Monocytes Absolute: 1 10*3/uL (ref 0.1–1.0)
Monocytes Relative: 11 %
Neutro Abs: 4 10*3/uL (ref 1.7–7.7)
Neutrophils Relative %: 42 %
Platelets: 371 10*3/uL (ref 150–400)
RBC: 3.87 MIL/uL — ABNORMAL LOW (ref 4.22–5.81)
RDW: 13.4 % (ref 11.5–15.5)
WBC: 9.5 10*3/uL (ref 4.0–10.5)
nRBC: 0 % (ref 0.0–0.2)

## 2019-11-06 LAB — FERRITIN: Ferritin: 891 ng/mL — ABNORMAL HIGH (ref 24–336)

## 2019-11-06 NOTE — Progress Notes (Signed)
Phlebotomy today. L AC used to remove 444cc of blood. Pt. Tolerated well.

## 2019-11-06 NOTE — Patient Instructions (Signed)

## 2019-11-27 ENCOUNTER — Other Ambulatory Visit: Payer: Self-pay | Admitting: Internal Medicine

## 2019-11-27 DIAGNOSIS — R911 Solitary pulmonary nodule: Secondary | ICD-10-CM

## 2019-12-03 ENCOUNTER — Other Ambulatory Visit: Payer: Self-pay

## 2019-12-03 ENCOUNTER — Ambulatory Visit: Payer: Federal, State, Local not specified - PPO

## 2019-12-03 ENCOUNTER — Inpatient Hospital Stay: Payer: Federal, State, Local not specified - PPO

## 2019-12-03 ENCOUNTER — Inpatient Hospital Stay: Payer: Federal, State, Local not specified - PPO | Attending: Hematology

## 2019-12-03 LAB — CBC WITH DIFFERENTIAL/PLATELET
Abs Immature Granulocytes: 0.02 10*3/uL (ref 0.00–0.07)
Basophils Absolute: 0.2 10*3/uL — ABNORMAL HIGH (ref 0.0–0.1)
Basophils Relative: 2 %
Eosinophils Absolute: 0.6 10*3/uL — ABNORMAL HIGH (ref 0.0–0.5)
Eosinophils Relative: 7 %
HCT: 37.5 % — ABNORMAL LOW (ref 39.0–52.0)
Hemoglobin: 14 g/dL (ref 13.0–17.0)
Immature Granulocytes: 0 %
Lymphocytes Relative: 33 %
Lymphs Abs: 3 10*3/uL (ref 0.7–4.0)
MCH: 36.7 pg — ABNORMAL HIGH (ref 26.0–34.0)
MCHC: 37.3 g/dL — ABNORMAL HIGH (ref 30.0–36.0)
MCV: 98.4 fL (ref 80.0–100.0)
Monocytes Absolute: 0.8 10*3/uL (ref 0.1–1.0)
Monocytes Relative: 9 %
Neutro Abs: 4.5 10*3/uL (ref 1.7–7.7)
Neutrophils Relative %: 49 %
Platelets: 357 10*3/uL (ref 150–400)
RBC: 3.81 MIL/uL — ABNORMAL LOW (ref 4.22–5.81)
RDW: 12.7 % (ref 11.5–15.5)
WBC: 9.2 10*3/uL (ref 4.0–10.5)
nRBC: 0 % (ref 0.0–0.2)

## 2019-12-03 LAB — FERRITIN: Ferritin: <4 ng/mL — ABNORMAL LOW (ref 24–336)

## 2019-12-03 NOTE — Patient Instructions (Signed)

## 2019-12-07 ENCOUNTER — Ambulatory Visit
Admission: RE | Admit: 2019-12-07 | Discharge: 2019-12-07 | Disposition: A | Discharge status: Home / Self Care | Payer: Federal, State, Local not specified - PPO | Source: Ambulatory Visit | Attending: Internal Medicine | Admitting: Internal Medicine

## 2019-12-07 ENCOUNTER — Other Ambulatory Visit: Payer: Self-pay

## 2019-12-07 DIAGNOSIS — R911 Solitary pulmonary nodule: Secondary | ICD-10-CM

## 2019-12-22 ENCOUNTER — Other Ambulatory Visit: Payer: Self-pay | Admitting: *Deleted
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
## 2019-12-23 ENCOUNTER — Inpatient Hospital Stay: Payer: Federal, State, Local not specified - PPO

## 2019-12-23 ENCOUNTER — Other Ambulatory Visit: Payer: Self-pay

## 2019-12-23 ENCOUNTER — Inpatient Hospital Stay: Payer: Federal, State, Local not specified - PPO | Attending: Hematology | Admitting: Hematology

## 2019-12-23 ENCOUNTER — Encounter: Payer: Self-pay | Admitting: Hematology

## 2019-12-23 ENCOUNTER — Telehealth: Payer: Self-pay | Admitting: Hematology

## 2019-12-23 LAB — CMP (CANCER CENTER ONLY)
ALT: 21 U/L (ref 0–44)
AST: 20 U/L (ref 15–41)
Albumin: 4.3 g/dL (ref 3.5–5.0)
Alkaline Phosphatase: 82 U/L (ref 38–126)
Anion gap: 11 (ref 5–15)
BUN: 12 mg/dL (ref 6–20)
CO2: 25 mmol/L (ref 22–32)
Calcium: 8.7 mg/dL — ABNORMAL LOW (ref 8.9–10.3)
Chloride: 103 mmol/L (ref 98–111)
Creatinine: 0.99 mg/dL (ref 0.61–1.24)
GFR, Est AFR Am: >60 mL/min (ref >60)
GFR, Estimated: >60 mL/min (ref >60)
Glucose, Bld: 202 mg/dL — ABNORMAL HIGH (ref 70–99)
Potassium: 4.2 mmol/L (ref 3.5–5.1)
Sodium: 139 mmol/L (ref 135–145)
Total Bilirubin: 1.2 mg/dL (ref 0.3–1.2)
Total Protein: 7.5 g/dL (ref 6.5–8.1)

## 2019-12-23 LAB — IRON AND TIBC
Iron: 108 ug/dL (ref 42–163)
Saturation Ratios: 39 % (ref 20–55)
TIBC: 279 ug/dL (ref 202–409)
UIBC: 171 ug/dL (ref 117–376)

## 2019-12-23 LAB — CBC WITH DIFFERENTIAL (CANCER CENTER ONLY)
Abs Immature Granulocytes: 0.01 10*3/uL (ref 0.00–0.07)
Basophils Absolute: 0.2 10*3/uL — ABNORMAL HIGH (ref 0.0–0.1)
Basophils Relative: 2 %
Eosinophils Absolute: 0.5 10*3/uL (ref 0.0–0.5)
Eosinophils Relative: 6 %
HCT: 39.5 % (ref 39.0–52.0)
Hemoglobin: 14.6 g/dL (ref 13.0–17.0)
Immature Granulocytes: 0 %
Lymphocytes Relative: 27 %
Lymphs Abs: 2.5 10*3/uL (ref 0.7–4.0)
MCH: 36 pg — ABNORMAL HIGH (ref 26.0–34.0)
MCHC: 37 g/dL — ABNORMAL HIGH (ref 30.0–36.0)
MCV: 97.5 fL (ref 80.0–100.0)
Monocytes Absolute: 0.9 10*3/uL (ref 0.1–1.0)
Monocytes Relative: 9 %
Neutro Abs: 5.3 10*3/uL (ref 1.7–7.7)
Neutrophils Relative %: 56 %
Platelet Count: 383 10*3/uL (ref 150–400)
RBC: 4.05 MIL/uL — ABNORMAL LOW (ref 4.22–5.81)
RDW: 12.5 % (ref 11.5–15.5)
WBC Count: 9.3 10*3/uL (ref 4.0–10.5)
nRBC: 0 % (ref 0.0–0.2)

## 2019-12-23 LAB — FERRITIN: Ferritin: 418 ng/mL — ABNORMAL HIGH (ref 24–336)

## 2019-12-23 NOTE — Telephone Encounter (Signed)
Scheduled appt per 1/13 los.  Sent a message to HIM pool to get a calendar mailed out.

## 2019-12-23 NOTE — Patient Instructions (Signed)

## 2019-12-28 ENCOUNTER — Ambulatory Visit: Payer: Federal, State, Local not specified - PPO | Admitting: Physician Assistant

## 2019-12-28 ENCOUNTER — Encounter: Payer: Self-pay | Admitting: Physician Assistant

## 2019-12-28 ENCOUNTER — Telehealth: Payer: Self-pay | Admitting: *Deleted

## 2019-12-28 ENCOUNTER — Telehealth: Payer: Self-pay | Admitting: Physician Assistant

## 2019-12-28 DIAGNOSIS — R932 Abnormal findings on diagnostic imaging of liver and biliary tract: Secondary | ICD-10-CM

## 2019-12-28 NOTE — Patient Instructions (Signed)
If you are age 61 or older, your body mass index should be between 23-30. Your Body mass index is 29.42 kg/m. If this is out of the aforementioned range listed, please consider follow up with your Primary Care Provider.  If you are age 24 or younger, your body mass index should be between 19-25. Your Body mass index is 29.42 kg/m. If this is out of the aformentioned range listed, please consider follow up with your Primary Care Provider.   Follow up in 6 months with Dr. Carlean Purl.

## 2019-12-28 NOTE — Telephone Encounter (Signed)
Returned patient's call. He is happy to have MRI. Please order. I told him you would be calling with that appointment. Thanks-JLL

## 2019-12-28 NOTE — Progress Notes (Addendum)
Chief Complaint: Hemochromatosis, cirrhosis  HPI:    Kurt Lloyd is a 59 year old male with a past medical history as listed below including hereditary hemochromatosis, hereditary spherocytosis, status post splenectomy, stroke x2 in 2007 and atrial septal defect, known to Dr. Carlean Purl, who was referred to me by Kurt Redwood, MD for a complaint of hemochromatosis and cirrhosis.      04/04/2012 screening colonoscopy was normal.  Repeat recommended in 10 years.    07/02/2019 ferritin 882.4.  07/03/2019 TIBC 2 42, U IBC 884, iron at 158, iron saturation 65.    07/17/2019 hemochromatosis testing revealing C282Y and H63D mutations.    08/22/2019 CT abdomen pelvis with contrast showed acute nondisplaced fractures of the left third through ninth ribs with adjacent left upper lobe pulmonary contusion and mild bibasilar atelectasis.  No acute abnormality within the abdomen or pelvis.  1.1 x 2.1 cm right middle lobe nodule, small periumbilical hernia containing fat and aortic atherosclerosis.    08/23/2019 CBC and BMP within normal limits.    09/30/2019 office visit with oncology for hemochromatosis.  In that office note it is noted that the patient's father and one of his brothers also have hemochromatosis.  That visit he was recommended he have monthly phlebotomy x3 and then they would consider maintenance phlebotomies every 6 months.  They are going to track ferritin levels.    12/07/2019 CT of the chest without contrast showed that the liver margin was irregular, it was thought this was cirrhosis.    12/23/2019 CBC and CMP within normal limits.  Ferritin 418.  Iron and TIBC normal.     Today, patient presents to clinic and tells me he feels well.  He has a family history of spherocytosis as well as hereditary hemochromatosis.  He remembers his father getting phlebotomies but in the end they were able to maintain him well.  Denies any GI symptoms today.  Tells me his primary care provider wanted him to follow-up  with [place] in regards to question of cirrhosis.    Denies fever, chills, weight loss, change in bowel habits, abdominal pain, heartburn, reflux or symptoms that awaken him from sleep.  Past Medical History:  Diagnosis Date  . Allergy   . Blood transfusion   . Diabetes mellitus   . GERD (gastroesophageal reflux disease)   . Heart murmur   . Hereditary spherocytosis (Kurt Lloyd)   . Hyperlipidemia   . Hypertension   . Stroke [facility])    x2 2007    Past Surgical History:  Procedure Laterality Date  . COLONOSCOPY    . SPLENECTOMY    . SVT ABLATION N/A 06/19/2019   Procedure: SVT ABLATION;  Surgeon: Evans Lance, MD;  Location: [facility];  Service: Cardiovascular;  Laterality: N/A;  . VASECTOMY      Current Outpatient Medications  Medication Sig Dispense Refill  . acetaminophen (TYLENOL) 500 MG tablet Take 2 tablets (1,000 mg total) by mouth every 8 (eight) hours. 30 tablet 0  . aspirin EC 81 MG tablet Take 81 mg by mouth daily.    Marland Kitchen EPINEPHrine 0.3 mg/0.3 mL IJ SOAJ injection Inject 0.3 mg into the muscle as needed for anaphylaxis (allergy shot).     . fexofenadine (ALLEGRA) 180 MG tablet Take 180 mg by mouth daily.    . fluticasone (FLONASE) 50 MCG/ACT nasal spray Place 1 spray into both nostrils daily.    Marland Kitchen gabapentin (NEURONTIN) 600 MG tablet Take 0.5 tablets (300 mg total) by mouth 2 (two)  times daily as needed. (Patient not taking: Reported on 12/23/2019) 30 tablet 0  . metFORMIN (GLUCOPHAGE) 500 MG tablet Take 1,000 mg by mouth 2 (two) times a day.     . methocarbamol (ROBAXIN) 750 MG tablet Take 1 tablet (750 mg total) by mouth every 6 (six) hours as needed for muscle spasms. (Patient not taking: Reported on 12/23/2019) 30 tablet 0  . Olopatadine HCl 0.6 % SOLN Place 2 sprays into the nose 2 (two) times daily.    Marland Kitchen OVER THE COUNTER MEDICATION Inject 1 Dose as directed once a week. Allergy Shots    . oxyCODONE (OXY IR/ROXICODONE) 5 MG immediate release tablet Take 1-2 tablets  (5-10 mg total) by mouth every 4 (four) hours as needed for moderate pain or severe pain. (Patient not taking: Reported on 12/23/2019) 30 tablet 0  . OZEMPIC, 0.25 OR 0.5 MG/DOSE, 2 MG/1.5ML SOPN Inject 0.25 mg into the skin once a week.     Kurt Lloyd Glycol-Propyl Glycol (SYSTANE) 0.4-0.3 % SOLN Place 1 drop into both eyes 3 (three) times daily as needed (dry/irritated eyes.).    Marland Kitchen rosuvastatin (CRESTOR) 40 MG tablet Take 40 mg by mouth daily.     No current facility-administered medications for this visit.    Allergies as of 12/28/2019  . (No Known Allergies)    Family History  Problem Relation Age of Onset  . Stroke Mother   . Lung cancer Father   . Hypertension Father   . Colon cancer Neg Hx   . Esophageal cancer Neg Hx   . Rectal cancer Neg Hx   . Stomach cancer Neg Hx     Social History   Socioeconomic History  . Marital status: Married    Spouse name: Not on file  . Number of children: Not on file  . Years of education: Not on file  . Highest education level: Not on file  Occupational History  . Not on file  Tobacco Use  . Smoking status: Never Smoker  . Smokeless tobacco: Never Used  Substance and Sexual Activity  . Alcohol use: Yes    Comment: occasionally  . Drug use: No  . Sexual activity: Not on file  Other Topics Concern  . Not on file  Social History Narrative  . Not on file   Social Determinants of Health   Financial Resource Strain:   . Difficulty of Paying Living Expenses: Not on file  Food Insecurity:   . Worried About Charity fundraiser in the Last Year: Not on file  . Ran Out of Food in the Last Year: Not on file  Transportation Needs:   . Lack of Transportation (Medical): Not on file  . Lack of Transportation (Non-Medical): Not on file  Physical Activity:   . Days of Exercise per Week: Not on file  . Minutes of Exercise per Session: Not on file  Stress:   . Feeling of Stress : Not on file  Social Connections:   . Frequency of  Communication with Friends and Family: Not on file  . Frequency of Social Gatherings with Friends and Family: Not on file  . Attends Religious Services: Not on file  . Active Member of Clubs or Organizations: Not on file  . Attends Archivist Meetings: Not on file  . Marital Status: Not on file  Intimate Partner Violence:   . Fear of Current or Ex-Partner: Not on file  . Emotionally Abused: Not on file  . Physically Abused: Not  on file  . Sexually Abused: Not on file    Review of Systems:    Constitutional: No weight loss, fever or chills Skin: No rash  Cardiovascular: No chest pain Respiratory: No SOB  Gastrointestinal: See HPI and otherwise negative Genitourinary: No dysuria Neurological: No headache, dizziness or syncope Musculoskeletal: No new muscle or joint pain Hematologic: No bleeding  Psychiatric: No history of depression or anxiety   Physical Exam:  Vital signs: BP 118/60 (BP Location: Left Arm, Patient Position: Sitting, Cuff Size: Normal)   Pulse 68   Temp 98.8 F (37.1 C)   Ht 5' 7.25" (1.708 m) Comment: height measured without shoes  Wt 189 lb 4 oz (85.8 kg)   BMI 29.42 kg/m   Constitutional:   Pleasant overweight Caucasian male appears to be in NAD, Well developed, Well nourished, alert and cooperative Head:  Normocephalic and atraumatic. Eyes:   PEERL, EOMI. No icterus. Conjunctiva pink. Ears:  Normal auditory acuity. Neck:  Supple Throat: Oral cavity and pharynx without inflammation, swelling or lesion.  Respiratory: Respirations even and unlabored. Lungs clear to auscultation bilaterally.   No wheezes, crackles, or rhonchi.  Cardiovascular: Normal S1, S2. No MRG. Regular rate and rhythm. No peripheral edema, cyanosis or pallor.  Gastrointestinal:  Soft, nondistended, nontender. No rebound or guarding. Normal bowel sounds. No appreciable masses or hepatomegaly. Rectal:  Not performed.  Msk:  Symmetrical without gross deformities. Without edema,  no deformity or joint abnormality.  Neurologic:  Alert and  oriented x4;  grossly normal neurologically.  Skin:   Dry and intact without significant lesions or rashes. Psychiatric: Demonstrates good judgement and reason without abnormal affect or behaviors.  RELEVANT LABS AND IMAGING: CBC    Component Value Date/Time   WBC 9.3 12/23/2019 1006   WBC 9.2 12/03/2019 0805   RBC 4.05 (L) 12/23/2019 1006   HGB 14.6 12/23/2019 1006   HGB 14.2 06/15/2019 0900   HCT 39.5 12/23/2019 1006   HCT 38.5 06/15/2019 0900   PLT 383 12/23/2019 1006   PLT 319 06/15/2019 0900   MCV 97.5 12/23/2019 1006   MCV 101 (H) 06/15/2019 0900   MCH 36.0 (H) 12/23/2019 1006   MCHC 37.0 (H) 12/23/2019 1006   RDW 12.5 12/23/2019 1006   RDW 12.8 06/15/2019 0900   LYMPHSABS 2.5 12/23/2019 1006   LYMPHSABS 3.3 (H) 06/15/2019 0900   MONOABS 0.9 12/23/2019 1006   EOSABS 0.5 12/23/2019 1006   EOSABS 0.6 (H) 06/15/2019 0900   BASOSABS 0.2 (H) 12/23/2019 1006   BASOSABS 0.2 06/15/2019 0900    CMP     Component Value Date/Time   NA 139 12/23/2019 1006   NA 137 06/15/2019 0900   K 4.2 12/23/2019 1006   CL 103 12/23/2019 1006   CO2 25 12/23/2019 1006   GLUCOSE 202 (H) 12/23/2019 1006   BUN 12 12/23/2019 1006   BUN 13 06/15/2019 0900   CREATININE 0.99 12/23/2019 1006   CALCIUM 8.7 (L) 12/23/2019 1006   PROT 7.5 12/23/2019 1006   ALBUMIN 4.3 12/23/2019 1006   AST 20 12/23/2019 1006   ALT 21 12/23/2019 1006   ALKPHOS 82 12/23/2019 1006   BILITOT 1.2 12/23/2019 1006   GFRNONAA >60 12/23/2019 1006   GFRAA >60 12/23/2019 1006    Assessment: 1.  Hemochromatosis: Diagnosed in September of last year, following with hematology  2.  Abnormal CT of the liver: CT of the chest with irregular liver margins and question of cirrhosis, labs are indicated otherwise  Plan: 1.  Discussed with the patient that all of his labs and work-up do not indicate cirrhosis at this time.  He did just have a CT of the chest with  irregular liver margins which raises the possibility of cirrhosis, though it is not primarily a radiographic diagnosis. 2.  Discussed case with Dr. Carlean Purl who suggested that we do an MRI with liver protocol for further evaluation. 3.  Patient was placed in recall for an appointment with Dr. Carlean Purl in 6 months.  Ellouise Newer, PA-C [facility] 12/28/2019, 10:01 AM  Cc: Kurt Redwood, MD   MRI showed  IMPRESSION: 1. Signs of hepatic iron deposition and cirrhosis. 2. LI-RADS category 3 lesion in the right hepatic lobe, showing some subtle intrinsic T1 hyperintensity as well but is not visible on other imaging sequences and shows no signs of washout or capsule appearance. Three to six-month follow-up may be helpful with MRI. 3. Status post splenomegaly. 4. Signs of prior trauma and other findings in the chest described above. Continued follow-up of right middle lobe findings as outlined in previous chest CT report.   Electronically Signed   By: Zetta Bills M.D.   On: 01/04/2020 09:10  We will re-image w/ MR in 3 mos  Gatha Mayer, MD, [facility]

## 2019-12-28 NOTE — Telephone Encounter (Signed)
Patient was informed of MRI. Patient voiced understanding.

## 2019-12-28 NOTE — Telephone Encounter (Signed)
You have been scheduled for an MRI at [facility] (1st floor radiology) on Monday 01/04/20. Your appointment time is 8 am. Please arrive 15 minutes prior to your appointment time for registration purposes. Please make certain not to have anything to eat or drink 6 hours prior to your test. In addition, if you have any metal in your body, have a pacemaker or defibrillator, please be sure to let your ordering physician know. This test typically takes 45 minutes to 1 hour to complete. Should you need to reschedule, please call [phone] to do so.

## 2019-12-28 NOTE — Telephone Encounter (Signed)
error 

## 2019-12-28 NOTE — Telephone Encounter (Signed)
-----   Message from [place], [place] sent at 12/28/2019 10:52 AM EST ----- Regarding: Can you order MRi abdomen with liver protocol Can you order MRI of abdomen with liver protocol for hereditary hemochromatosis and abnormal ct of liver.  Thanks-JLL

## 2020-01-04 ENCOUNTER — Other Ambulatory Visit: Payer: Self-pay

## 2020-01-04 ENCOUNTER — Ambulatory Visit (HOSPITAL_COMMUNITY)
Admission: RE | Admit: 2020-01-04 | Discharge: 2020-01-04 | Disposition: A | Discharge status: Home / Self Care | Payer: Federal, State, Local not specified - PPO | Source: Ambulatory Visit | Attending: Physician Assistant | Admitting: Physician Assistant

## 2020-01-04 DIAGNOSIS — R932 Abnormal findings on diagnostic imaging of liver and biliary tract: Secondary | ICD-10-CM | POA: Diagnosis present

## 2020-01-04 MED ORDER — GADOBUTROL 1 MMOL/ML IV SOLN
9.0000 mL | Freq: Once | INTRAVENOUS | Status: AC | PRN
  Administered 2020-01-04: 9 mL via INTRAVENOUS

## 2020-01-05 ENCOUNTER — Other Ambulatory Visit: Payer: Self-pay

## 2020-01-05 ENCOUNTER — Encounter: Payer: Self-pay | Admitting: Internal Medicine

## 2020-01-05 ENCOUNTER — Telehealth: Payer: Self-pay

## 2020-01-05 ENCOUNTER — Ambulatory Visit: Payer: Federal, State, Local not specified - PPO | Admitting: Internal Medicine

## 2020-01-05 VITALS — BP 118/66 | HR 56 | Ht 67.0 in | Wt 187.4 lb

## 2020-01-05 DIAGNOSIS — R911 Solitary pulmonary nodule: Secondary | ICD-10-CM

## 2020-01-05 DIAGNOSIS — J849 Interstitial pulmonary disease, unspecified: Secondary | ICD-10-CM

## 2020-01-05 DIAGNOSIS — R918 Other nonspecific abnormal finding of lung field: Secondary | ICD-10-CM

## 2020-01-05 LAB — SEDIMENTATION RATE: Sed Rate: 6 mm/hr (ref 0–20)

## 2020-01-05 LAB — PROTIME-INR
INR: 1 ratio (ref 0.8–1.0)
Prothrombin Time: 11.6 s (ref 9.6–13.1)
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
## 2020-01-05 NOTE — Patient Instructions (Addendum)
ICD-10-CM   1. ILD (interstitial lung disease) ([place])  J84.9   2. Nodule of middle lobe of right lung  R91.1      ILD -  You might have intersitial lung disease aka pulmonary fibrosis - not sure Alternatively it might be healing scar of both lung bases from pneumonia in the 1980s  Plan - ILD questionnaire today  - Serum: ESR, ACE, ANA, DS-DNA, RF, anti-CCP,  ANCA screen, MPO, PR-3, Total CK,  Aldolase, scl-70, ssA, ssB, anti-RNP, anti-JO-1,  Hypersensitivity Pneumonitis Panel   - Spiro/dlco breathing test next few weeks  Right middle lobe nodule -September 2020 and January 2021    - this might be mucus, or hidden tumor or healing cast airway from the pneumonia in 1980s  Plan  - PEt scan  - BLood INR  Followup  - visit with Dr Lamonte Sakai to review PET Scan results   - Dr Chase Caller in March/April 2021 but after completing breathing test and completing visit with Dr Lamonte Sakai

## 2020-01-05 NOTE — Telephone Encounter (Signed)
Left message to please call back. °

## 2020-01-07 LAB — ANTI-JO 1 ANTIBODY, IGG: Anti JO-1: <0.2 AI (ref 0.0–0.9)

## 2020-01-10 LAB — MPO/PR-3 (ANCA) ANTIBODIES
Myeloperoxidase Abs: <1 AI
Serine Protease 3: <1 AI

## 2020-01-10 LAB — ANTI-SCLERODERMA ANTIBODY: Scleroderma (Scl-70) (ENA) Antibody, IgG: <1 AI

## 2020-01-10 LAB — HYPERSENSITIVITY PNUEMONITIS PROFILE
ASPERGILLUS FUMIGATUS: NEGATIVE
Faenia retivirgula: NEGATIVE
Pigeon Serum: NEGATIVE
S. VIRIDIS: NEGATIVE
T. CANDIDUS: NEGATIVE
T. VULGARIS: NEGATIVE

## 2020-01-10 LAB — SJOGREN'S SYNDROME ANTIBODS(SSA + SSB)
SSA (Ro) (ENA) Antibody, IgG: <1 AI
SSB (La) (ENA) Antibody, IgG: <1 AI

## 2020-01-10 LAB — ANA: Anti Nuclear Antibody (ANA): NEGATIVE

## 2020-01-10 LAB — CK TOTAL AND CKMB (NOT AT ARMC)
CK, MB: 0.8 ng/mL (ref 0–5.0)
Relative Index: 1.1 (ref 0–4.0)
Total CK: 71 U/L (ref 44–196)

## 2020-01-10 LAB — CYCLIC CITRUL PEPTIDE ANTIBODY, IGG: Cyclic Citrullin Peptide Ab: 27 UNITS — ABNORMAL HIGH

## 2020-01-10 LAB — ANGIOTENSIN CONVERTING ENZYME: Angiotensin-Converting Enzyme: 32 U/L (ref 9–67)

## 2020-01-10 LAB — ANCA SCREEN W REFLEX TITER: ANCA Screen: NEGATIVE

## 2020-01-10 LAB — ANTI-DNA ANTIBODY, DOUBLE-STRANDED: ds DNA Ab: 1 IU/mL

## 2020-01-10 LAB — RHEUMATOID FACTOR: Rheumatoid fact SerPl-aCnc: <14 IU/mL (ref <14)

## 2020-01-10 LAB — ALDOLASE: Aldolase: 3.8 U/L (ref <=8.1)

## 2020-01-14 ENCOUNTER — Encounter (HOSPITAL_COMMUNITY): Payer: Federal, State, Local not specified - PPO

## 2020-01-19 ENCOUNTER — Other Ambulatory Visit: Payer: Self-pay

## 2020-01-19 ENCOUNTER — Ambulatory Visit (HOSPITAL_COMMUNITY)
Admission: RE | Admit: 2020-01-19 | Discharge: 2020-01-19 | Disposition: A | Discharge status: Home / Self Care | Payer: Federal, State, Local not specified - PPO | Source: Ambulatory Visit | Attending: Internal Medicine | Admitting: Internal Medicine

## 2020-01-19 ENCOUNTER — Encounter (HOSPITAL_COMMUNITY): Payer: Self-pay

## 2020-01-19 DIAGNOSIS — R918 Other nonspecific abnormal finding of lung field: Secondary | ICD-10-CM | POA: Insufficient documentation

## 2020-01-19 LAB — GLUCOSE, CAPILLARY: Glucose-Capillary: 176 mg/dL — ABNORMAL HIGH (ref 70–99)

## 2020-01-19 MED ORDER — FLUDEOXYGLUCOSE F - 18 (FDG) INJECTION
9.4100 | Freq: Once | INTRAVENOUS | Status: AC | PRN
  Administered 2020-01-19: 9.41 via INTRAVENOUS

## 2020-01-28 ENCOUNTER — Encounter: Payer: Self-pay | Admitting: Emergency Medicine

## 2020-01-28 ENCOUNTER — Telehealth (INDEPENDENT_AMBULATORY_CARE_PROVIDER_SITE_OTHER): Payer: Federal, State, Local not specified - PPO | Admitting: Emergency Medicine

## 2020-01-28 DIAGNOSIS — R911 Solitary pulmonary nodule: Secondary | ICD-10-CM | POA: Diagnosis not present
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
## 2020-01-28 NOTE — Assessment & Plan Note (Signed)
I reviewed the CT scan, PET scan results with the patient today.  No hypermetabolism in the right middle lobe opacity, most consistent with impacted mucus, bronchiectatic change.  I do not think he needs biopsy.  His interstitial disease will be followed by Dr. Chase Caller and we can assess for stability in size on his repeat imaging there.  He also has calcified mediastinal lymphadenopathy, is from [place] and this almost certainly represents histoplasmosis.  I will be available to help with bronchoscopy if the nodular focus changes in any way on his surveillance imaging.

## 2020-02-04 NOTE — Progress Notes (Signed)
Serology - negative

## 2020-02-16 ENCOUNTER — Other Ambulatory Visit: Payer: Self-pay

## 2020-02-17 ENCOUNTER — Inpatient Hospital Stay: Payer: Federal, State, Local not specified - PPO | Attending: Hematology

## 2020-02-17 ENCOUNTER — Inpatient Hospital Stay: Payer: Federal, State, Local not specified - PPO

## 2020-02-17 ENCOUNTER — Other Ambulatory Visit: Payer: Self-pay

## 2020-02-17 LAB — CBC WITH DIFFERENTIAL (CANCER CENTER ONLY)
Abs Immature Granulocytes: 0.03 10*3/uL (ref 0.00–0.07)
Basophils Absolute: 0.2 10*3/uL — ABNORMAL HIGH (ref 0.0–0.1)
Basophils Relative: 2 %
Eosinophils Absolute: 0.5 10*3/uL (ref 0.0–0.5)
Eosinophils Relative: 5 %
HCT: 41.7 % (ref 39.0–52.0)
Hemoglobin: 15.3 g/dL (ref 13.0–17.0)
Immature Granulocytes: 0 %
Lymphocytes Relative: 34 %
Lymphs Abs: 3.3 10*3/uL (ref 0.7–4.0)
MCH: 34.8 pg — ABNORMAL HIGH (ref 26.0–34.0)
MCHC: 36.7 g/dL — ABNORMAL HIGH (ref 30.0–36.0)
MCV: 94.8 fL (ref 80.0–100.0)
Monocytes Absolute: 1 10*3/uL (ref 0.1–1.0)
Monocytes Relative: 10 %
Neutro Abs: 4.7 10*3/uL (ref 1.7–7.7)
Neutrophils Relative %: 49 %
Platelet Count: 342 10*3/uL (ref 150–400)
RBC: 4.4 MIL/uL (ref 4.22–5.81)
RDW: 12.6 % (ref 11.5–15.5)
WBC Count: 9.7 10*3/uL (ref 4.0–10.5)
nRBC: 0 % (ref 0.0–0.2)

## 2020-02-17 LAB — IRON AND TIBC
Iron: 151 ug/dL (ref 42–163)
Saturation Ratios: 53 % (ref 20–55)
TIBC: 283 ug/dL (ref 202–409)
UIBC: 132 ug/dL (ref 117–376)

## 2020-02-17 LAB — CMP (CANCER CENTER ONLY)
ALT: 21 U/L (ref 0–44)
AST: 21 U/L (ref 15–41)
Albumin: 4.3 g/dL (ref 3.5–5.0)
Alkaline Phosphatase: 79 U/L (ref 38–126)
Anion gap: 8 (ref 5–15)
BUN: 13 mg/dL (ref 6–20)
CO2: 27 mmol/L (ref 22–32)
Calcium: 9.3 mg/dL (ref 8.9–10.3)
Chloride: 104 mmol/L (ref 98–111)
Creatinine: 1.09 mg/dL (ref 0.61–1.24)
GFR, Est AFR Am: >60 mL/min (ref >60)
GFR, Estimated: >60 mL/min (ref >60)
Glucose, Bld: 213 mg/dL — ABNORMAL HIGH (ref 70–99)
Potassium: 4 mmol/L (ref 3.5–5.1)
Sodium: 139 mmol/L (ref 135–145)
Total Bilirubin: 1.5 mg/dL — ABNORMAL HIGH (ref 0.3–1.2)
Total Protein: 7.6 g/dL (ref 6.5–8.1)

## 2020-02-17 LAB — FERRITIN: Ferritin: 639 ng/mL — ABNORMAL HIGH (ref 24–336)

## 2020-02-17 NOTE — Patient Instructions (Signed)

## 2020-02-17 NOTE — Progress Notes (Signed)
Esther Hardy presents today for phlebotomy per MD orders of Hgb > 11, Fe > 100.  Phlebotomy procedure started at 0938 and ended at 0944 with 531 grams removed. A 16 G phlebotomy set was used and IV needle was removed intact. Patient tolerated procedure well.   Patient given beverage and food and was observed for 30 minutes after procedure without any incident. VSS upon leaving unit.

## 2020-02-23 ENCOUNTER — Telehealth: Payer: Self-pay | Admitting: Emergency Medicine

## 2020-02-23 NOTE — Telephone Encounter (Signed)
Spoke with pt, he would like to know if he needed to go to the appointments scheduled because his CT scan was normal. Dr Lamonte Sakai please advise if pt can cancel upcoming appt and PFT.

## 2020-02-24 NOTE — Telephone Encounter (Signed)
I was under the impression that Dr Chase Caller wanted PFT and follow up to continue to evaluate ILD. He probable needs to keep the appointments - you could ask MR to confirm.

## 2020-02-24 NOTE — Telephone Encounter (Signed)
Message routed to Dr. Chase Caller to advise

## 2020-02-26 NOTE — Telephone Encounter (Signed)
Called and spoke with pt letting him know to keep appts as scheduled and he verbalized understanding. Nothing further needed.

## 2020-02-26 NOTE — Telephone Encounter (Signed)
kee PFT app and visit with Warner Mccreedy end march 2021  I will see him in May - schedule to open next few days

## 2020-02-27 ENCOUNTER — Other Ambulatory Visit (HOSPITAL_COMMUNITY)
Admission: RE | Admit: 2020-02-27 | Discharge: 2020-02-27 | Disposition: A | Discharge status: Home / Self Care | Payer: Federal, State, Local not specified - PPO | Source: Ambulatory Visit | Attending: Emergency Medicine | Admitting: Emergency Medicine

## 2020-02-27 DIAGNOSIS — Z20822 Contact with and (suspected) exposure to covid-19: Secondary | ICD-10-CM | POA: Diagnosis not present

## 2020-02-27 DIAGNOSIS — Z01812 Encounter for preprocedural laboratory examination: Secondary | ICD-10-CM | POA: Diagnosis not present

## 2020-02-27 LAB — SARS CORONAVIRUS 2 (TAT 6-24 HRS): SARS Coronavirus 2: NEGATIVE

## 2020-03-02 ENCOUNTER — Encounter: Payer: Self-pay | Admitting: Pulmonary Disease

## 2020-03-02 ENCOUNTER — Ambulatory Visit (INDEPENDENT_AMBULATORY_CARE_PROVIDER_SITE_OTHER): Payer: Federal, State, Local not specified - PPO | Admitting: Internal Medicine

## 2020-03-02 ENCOUNTER — Other Ambulatory Visit: Payer: Self-pay

## 2020-03-02 ENCOUNTER — Ambulatory Visit: Payer: Federal, State, Local not specified - PPO | Admitting: Pulmonary Disease

## 2020-03-02 VITALS — BP 118/74 | HR 60 | Temp 97.1°F | Ht 69.0 in | Wt 193.8 lb

## 2020-03-02 DIAGNOSIS — J849 Interstitial pulmonary disease, unspecified: Secondary | ICD-10-CM | POA: Diagnosis not present

## 2020-03-02 DIAGNOSIS — R911 Solitary pulmonary nodule: Secondary | ICD-10-CM | POA: Diagnosis not present

## 2020-03-02 DIAGNOSIS — Z0000 Encounter for general adult medical examination without abnormal findings: Secondary | ICD-10-CM | POA: Insufficient documentation

## 2020-03-02 DIAGNOSIS — K219 Gastro-esophageal reflux disease without esophagitis: Secondary | ICD-10-CM | POA: Diagnosis not present

## 2020-03-02 NOTE — Patient Instructions (Addendum)
You were seen today by Lauraine Rinne, NP  for:   Pleasure meeting you in office today.  We reviewed your lab work results as well as your breathing tests and discussed your CT imaging.  Please let us know if you have any changes in your breathing.  We will repeat a breathing test in about 6 months in September/2021.  We will also get a high-resolution CT of your chest this is the more detailed CT that I spoke with you about in May/2021.  Remain active.  We will reach out to your primary care to see if we can get your pneumonia shot records.  Please call if you have any questions or concerns  Take care and stay safe,  Kurt Lloyd  1. ILD (interstitial lung disease) ([place])  - CT Chest High Resolution; Future - Pulmonary function test; Future  Walk today in office -  You did great   2. Healthcare maintenance  We will request her pneumonia vaccine records from primary care  We would recommend the Covid vaccine  COVID-19 Vaccine Information can be found at: [email or web address].[email or web address] For questions related to vaccine distribution or appointments, please email [email or web address] .[email or web address] or call [phone].     We recommend today:  Orders Placed This Encounter  Procedures  . CT Chest High Resolution    -High-res CT with supine and prone positioning -inspiratory and expiratory cuts.  -Only to be read by Dr. Rosario Jacks and Dr. Weber Cooks.    Standing Status:   Future    Standing Expiration Date:   05/02/2021    Scheduling Instructions:     Complete around May/25/21    Order Specific Question:   Preferred imaging location?    Answer:   [address]    Order Specific Question:   Radiology Contrast Protocol - do NOT remove file path    Answer:   [email or web address]  . Pulmonary function test    Arlyce Harman with dlco    Standing Status:   Future    Standing Expiration Date:   03/02/2021    Scheduling Instructions:   Sept/2021    Order Specific Question:   Where should this test be performed?    Answer:   [facility]   Orders Placed This Encounter  Procedures  . CT Chest High Resolution  . Pulmonary function test   No orders of the defined types were placed in this encounter.   Follow Up:    Return in about 3 months (around 06/02/2020), or if symptoms worsen or fail to improve, for Follow up with Dr. Purnell Shoemaker, After Chest CT.   Please do your part to reduce the spread of COVID-19:      Reduce your risk of any infection  and COVID19 by using the similar precautions used for avoiding the common cold or flu:  Marland Kitchen Wash your hands often with soap and warm water for at least 20 seconds.  If soap and water are not readily available, use an alcohol-based hand sanitizer with at least 60% alcohol.  . If coughing or sneezing, cover your mouth and nose by coughing or sneezing into the elbow areas of your shirt or coat, into a tissue or into your sleeve (not your hands). Langley Gauss A MASK when in public  . Avoid shaking hands with others and consider head nods or verbal greetings only. . Avoid touching your eyes, nose, or mouth with unwashed hands.  . Avoid close contact with people who are sick. . Avoid places or events  with large numbers of people in one location, like concerts or sporting events. . If you have some symptoms but not all symptoms, continue to monitor at home and seek medical attention if your symptoms worsen. . If you are having a medical emergency, call 911.   Reading / e-Visit: [email or web address]         [facility]: [facility]: [phone]                   [facility]: [phone]     It is flu season:   >>> Best ways to protect herself from the flu: Receive the yearly flu vaccine, practice good hand hygiene washing  with soap and also using hand sanitizer when available, eat a nutritious meals, get adequate rest, hydrate appropriately   Please contact the office if your symptoms worsen or you have concerns that you are not improving.   Thank you for choosing [facility] for your healthcare, and for allowing [place] to partner with you on your healthcare journey. I am thankful to be able to provide care to you today.   Kurt Quaker FNP-C

## 2020-03-02 NOTE — Assessment & Plan Note (Signed)
Patient reports occasional acid reflux symptoms about once every 2 months Patient reports this is directly related to dietary choices specifically spicy foods He has over-the-counter Nexium that he will use in those instances as well as Tums  Plan: Patient to let us know if he starts having more active reflux such as 1-2 events a month If this occurs then we will consider putting him on a controller therapy such as PPI Encourage patient to avoid dietary choices that cause acid reflux

## 2020-03-02 NOTE — Progress Notes (Signed)
@Patient  ID: Kurt Lloyd, male    DOB: August 22, 1961, 59 y.o.   MRN: [id number]  Chief Complaint  Patient presents with  . Follow-up    PFT - Arlyce Harman with DLCO    Referring provider: Marton Redwood, MD  HPI:  59 year old male never smoker followed in our office for interstitial lung disease  PMH: Hypertension, hyperlipidemia, palpitations, type 2 diabetes Smoker/ Smoking History:  Maintenance:  None Pt of: Dr. Chase Caller  03/02/2020  - Visit   59 year old male never smoker followed in our office for interstitial lung disease presenting today after completing a spirometry with DLCO.  Patient was last seen in our office in January/2021 by Dr. Chase Caller it was felt at that time that he may have interstitial lung disease lab work was performed as well as a breathing test was ordered.  Based off lab work as well as breathing test may need to consider high-resolution CT chest.  There is also follow-up needed regarding his right middle lobe nodule.  He was planned to have an appointment with Dr. Lamonte Sakai to further evaluate this.  At this appointment.  This appointment with Dr. Lamonte Sakai was completed virtually in February/2021.  PET scan was negative for hypermetabolic some of the right middle lobe.  Consistent with impacted mucus.  Not felt that he needs a biopsy.  Patient's pulmonary function test from today are listed below:  03/02/2020-pulmonary function test-spirometry with DLCO-FVC 4.14 (90% predicted), ratio 79, FEV1 3.28 (95% predicted), DLCO 18.63 (69% predicted)  Patient reporting that he is doing well today.  He has no acute symptoms except for nasal congestion due to seasonal allergies.  He is not on any maintenance inhalers.  He does not report any shortness of breath or limitations with his physical activity.  Walk today in office patient had no oxygen desaturations and completed 3 laps last oxygen saturation was 98%.  No elevations in heart rate.  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  He did not wear a mask while doing these.  Questionaires / Pulmonary Flowsheets:   MMRC: mMRC Dyspnea Scale mMRC Score  03/02/2020 0    Tests:   01/05/2020-connective tissue lab work: MPO/PR-3 ANCA antibodies-negative ANCA screen-negative CCP-27, moderately elevated week positive Anti-DNA antibody double-stranded, negative Rheumatoid factor-less than 14, negative Sed rate-6 Antiscleroderma antibody-negative ANA-negative Anti-Jo 1 antibody-negative Hypersensitivity pneumonitis panel-negative Sjogren syndrome antibody-negative ACE level-32, released negative Aldolase-3.8, negative CK total and CK-MB-negative  12/07/2019-CT chest without contrast-similar tubular lesion with adjacent volume loss in the medial segment of right middle lobe, findings which may be due to bronchiectasis and mucoid impaction, difficult to definitively exclude malignancy, residual bibasilar subpleural reticulation and groundglass finding suggestive of interstitial lung disease such as NSIP or UIP, cirrhosis, aortic arthrosclerosis, slightly enlarged pulmonary trunk  0/07/6760-PJK scan-no metabolic activity associated with tubular lesion in the right middle lobe consistent with benign etiology, mild metabolic activity associated with minimally large partially calcified mediastinal lymph nodes in the right lower paratracheal location, favor reactive adenopathy  12/29/2018-echocardiogram-LV ejection fraction 55 to 93%, grade 1 diastolic dysfunction  2/67/12 - ILD  [facility] Integrated Comprehensive ILD Questionnaire  Symptoms:  -He is essentially asymptomatic and all this is incidental finding.  Although early in the morning he does cough up some yellow phlegm and sometimes he feels a tickle in the throat and he clears the throat.  SYMPTOM SCALE - ILD 01/05/2020   O2 use ra  Shortness of Breath 0 ->  5 scale with 5 being worst (score 6 If  unable to do)  At rest 0  Simple tasks - showers, clothes change, eating, shaving 0  Household (dishes, doing bed, laundry) 0  Shopping 0  Walking level at own pace 0  Walking up Stairs 0  Total (30-36) Dyspnea Score 0  How bad is your cough? 0  How bad is your fatigue 0  How bad is nausea 0  How bad is vomiting?  0  How bad is diarrhea? 0  How bad is anxiety? 0  How bad is depression 0    Past Medical History : Denies asthma COPD or heart failure collagen vascular disease or vasculitis.  Denies HIV.  Denies seizures.  Denies tuberculosis.  Denies hepatitis or kidney disease.  Denies heart disease or pleurisy.  He does have a history of sleep apnea for several years he lost weight and he stopped snoring after that no apneic spells does not use CPAP.  Does have diabetes for the last several years.  Has a previous history of stroke in 2007 without residual deficits.  In the 1980s for a few weeks he had pneumonia not otherwise specified and in 1990s had a blood clot to the right hand not otherwise specified  ROS: For the last several decades he has had some dysphagia on and off.  He also has discoloration of his fingers particularly when it gets cold for the last few decades.  Nevertheless it does not turn blue.  He also has heartburn after spicy food for the last few decades.  But denies any snoring or rash or ulcers or nausea or arthralgia fatigue  FAMILY HISTORY of LUNG DISEASE: Father had COPD but denies any pulmonary fibrosis.  EXPOSURE HISTORY: Not a smoker but when he was growing up his parents smoke tobacco.  No electronic cigarette use.  No vaping.  No use of marijuana no use cocaine.  No intravenous drug use.  HOME and HOBBY DETAILS : [place] the age of the home is 30 years.  There is dampness in the house a couple of water leaks with pipes and appliances.  Otherwise extensive organic antigen exposure in the house environment is  negative.  When he was growing up he did use Pepto-Bismol.  OCCUPATIONAL HISTORY (122 questions) : He does some gardening around the house.  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  History Is Otherwise Negative  PULMONARY TOXICITY HISTORY (27 items):  negative  FENO:  No results found for: NITRICOXIDE  PFT: PFT Results Latest Ref Rng & Units 03/02/2020  FVC-Pre L 4.14  FVC-Predicted Pre % 90  Pre FEV1/FVC % % 79  FEV1-Pre L 3.28  FEV1-Predicted Pre % 95  DLCO UNC% % 69  DLCO COR %Predicted % 73      Imaging: No results found.  Lab Results:  CBC    Component Value Date/Time   WBC 9.7 02/17/2020 0906   WBC 9.2 12/03/2019 0805   RBC 4.40 02/17/2020 0906   HGB 15.3 02/17/2020 0906   HGB 14.2 06/15/2019 0900   HCT 41.7 02/17/2020 0906   HCT 38.5 06/15/2019 0900   PLT 342 02/17/2020 0906   PLT 319 06/15/2019 0900   MCV 94.8 02/17/2020 0906   MCV 101 (H) 06/15/2019  0900   MCH 34.8 (H) 02/17/2020 0906   MCHC 36.7 (H) 02/17/2020 0906   RDW 12.6 02/17/2020 0906   RDW 12.8 06/15/2019 0900   LYMPHSABS 3.3 02/17/2020 0906   LYMPHSABS 3.3 (H) 06/15/2019 0900   MONOABS 1.0 02/17/2020 0906   EOSABS 0.5 02/17/2020 0906   EOSABS 0.6 (H) 06/15/2019 0900   BASOSABS 0.2 (H) 02/17/2020 0906   BASOSABS 0.2 06/15/2019 0900    BMET    Component Value Date/Time   NA 139 02/17/2020 0906   NA 137 06/15/2019 0900   K 4.0 02/17/2020 0906   CL 104 02/17/2020 0906   CO2 27 02/17/2020 0906   GLUCOSE 213 (H) 02/17/2020 0906   BUN 13 02/17/2020 0906   BUN 13 06/15/2019 0900   CREATININE 1.09 02/17/2020 0906   CALCIUM 9.3 02/17/2020 0906   GFRNONAA >60 02/17/2020 0906   GFRAA >60 02/17/2020 0906    BNP No results found for: BNP  ProBNP No results found for: PROBNP  Specialty Problems      Pulmonary Problems    Pulmonary nodule   ILD (interstitial lung disease) ([place])    01/05/2020-connective tissue lab work: MPO/PR-3 ANCA antibodies-negative ANCA screen-negative CCP-27, moderately elevated week positive Anti-DNA antibody double-stranded, negative Rheumatoid factor-less than 14, negative Sed rate-6 Antiscleroderma antibody-negative ANA-negative Anti-Jo 1 antibody-negative Hypersensitivity pneumonitis panel-negative Sjogren syndrome antibody-negative ACE level-32, released negative Aldolase-3.8, negative CK total and CK-MB-negative  12/07/2019-CT chest without contrast-similar tubular lesion with adjacent volume loss in the medial segment of right middle lobe, findings which may be due to bronchiectasis and mucoid impaction, difficult to definitively exclude malignancy, residual bibasilar subpleural reticulation and groundglass finding suggestive of interstitial lung disease such as NSIP or UIP, cirrhosis, aortic arthrosclerosis, slightly enlarged pulmonary trunk  12/29/2018-echocardiogram-LV ejection fraction 55 to 19%, grade 1 diastolic dysfunction  03/27/39 - ILD  [place] Integrated Comprehensive ILD Questionnaire  Symptoms:  -He is essentially asymptomatic and all this is incidental finding.  Although early in the morning he does cough up some yellow phlegm and sometimes he feels a tickle in the throat and he clears the throat.  SYMPTOM SCALE - ILD 01/05/2020   O2 use ra  Shortness of Breath 0 -> 5 scale with 5 being worst (score 6 If unable to do)  At rest 0  Simple tasks - showers, clothes change, eating, shaving 0  Household (dishes, doing bed, laundry) 0  Shopping 0  Walking level at own pace 0  Walking up Stairs 0  Total (30-36) Dyspnea Score 0  How bad is your cough? 0  How bad is your fatigue 0  How bad is nausea 0  How bad is vomiting?  0  How bad is diarrhea? 0  How bad is anxiety? 0  How bad is depression 0    Past Medical History : Denies asthma COPD or heart failure  collagen vascular disease or vasculitis.  Denies HIV.  Denies seizures.  Denies tuberculosis.  Denies hepatitis or kidney disease.  Denies heart disease or pleurisy.  He does have a history of sleep apnea for several years he lost weight and he stopped snoring after that no apneic spells does not use CPAP.  Does have diabetes for the last several years.  Has a previous history of stroke in 2007 without residual deficits.  In the 1980s for a few weeks he had pneumonia not otherwise specified and in 1990s had a blood clot to the right hand not otherwise specified  ROS: For the last  several decades he has had some dysphagia on and off.  He also has discoloration of his fingers particularly when it gets cold for the last few decades.  Nevertheless it does not turn blue.  He also has heartburn after spicy food for the last few decades.  But denies any snoring or rash or ulcers or nausea or arthralgia fatigue  FAMILY HISTORY of LUNG DISEASE: Father had COPD but denies any pulmonary fibrosis.  EXPOSURE HISTORY: Not a smoker but when he was growing up his parents smoke tobacco.  No electronic cigarette use.  No vaping.  No use of marijuana no use cocaine.  No intravenous drug use.  HOME and HOBBY DETAILS : [place] the age of the home is 30 years.  There is dampness in the house a couple of water leaks with pipes and appliances.  Otherwise extensive organic antigen exposure in the house environment is negative.  When he was growing up he did use Pepto-Bismol.  OCCUPATIONAL HISTORY (122 questions) : He does some gardening around the house.  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  History Is Otherwise Negative  PULMONARY TOXICITY HISTORY (27 items):  negative         No Known  Allergies  Immunization History  Administered Date(s) Administered  . Influenza,inj,Quad PF,6-35 Mos 09/10/2019  . Influenza-Unspecified 10/20/2018   Pneumo vaccine with PCP   Past Medical History:  Diagnosis Date  . Allergy   . Blood transfusion   . Diabetes mellitus   . DVT (deep venous thrombosis) ([place])    right  . GERD (gastroesophageal reflux disease)   . Heart murmur   . Hereditary spherocytosis ([place])   . Hyperlipidemia   . Hypertension   . Stroke [facility])    x2 2007    Tobacco History: Social History   Tobacco Use  Smoking Status Never Smoker  Smokeless Tobacco Never Used   Counseling given: Yes   Continue to not smoke  Outpatient Encounter Medications as of 03/02/2020  Medication Sig  . aspirin EC 81 MG tablet Take 81 mg by mouth daily.  Marland Kitchen EPINEPHrine 0.3 mg/0.3 mL IJ SOAJ injection Inject 0.3 mg into the muscle as needed for anaphylaxis (allergy shot).   . fexofenadine (ALLEGRA) 180 MG tablet Take 180 mg by mouth daily.  . fluticasone (FLONASE) 50 MCG/ACT nasal spray Place 1 spray into both nostrils daily.  . metFORMIN (GLUCOPHAGE) 500 MG tablet Take 1,000 mg by mouth 2 (two) times a day.   . Olopatadine HCl 0.6 % SOLN Place 2 sprays into the nose 2 (two) times daily.  Marland Kitchen OVER THE COUNTER MEDICATION Inject 1 Dose as directed once a week. Allergy Shots  . OZEMPIC, 0.25 OR 0.5 MG/DOSE, 2 MG/1.5ML SOPN Inject 0.25 mg into the skin once a week.   Vladimir Faster Glycol-Propyl Glycol (SYSTANE) 0.4-0.3 % SOLN Place 1 drop into both eyes 3 (three) times daily as needed (dry/irritated eyes.).  Marland Kitchen rosuvastatin (CRESTOR) 40 MG tablet Take 40 mg by mouth daily.  . [DISCONTINUED] acetaminophen (TYLENOL) 500 MG tablet Take 2 tablets (1,000 mg total) by mouth every 8 (eight) hours.   No facility-administered encounter medications on file as of 03/02/2020.     Review of Systems  Review of Systems  Constitutional: Negative for  activity change, chills, fatigue, fever and  unexpected weight change.  HENT: Positive for congestion and postnasal drip. Negative for rhinorrhea, sinus pressure, sinus pain and sore throat.   Eyes: Negative.   Respiratory: Negative for cough, shortness of breath and wheezing.   Cardiovascular: Negative for chest pain and palpitations.  Gastrointestinal: Negative for constipation, diarrhea, nausea and vomiting.  Endocrine: Negative.   Genitourinary: Negative.   Musculoskeletal: Negative.   Skin: Negative.   Neurological: Negative for dizziness and headaches.  Psychiatric/Behavioral: Negative.  Negative for dysphoric mood. The patient is not nervous/anxious.   All other systems reviewed and are negative.    Physical Exam  BP 118/74   Pulse 60   Temp (!) 97.1 F (36.2 C) (Temporal)   Ht 5\' 9"  (1.753 m)   Wt 193 lb 12.8 oz (87.9 kg)   SpO2 95% Comment: on RA  BMI 28.62 kg/m   Wt Readings from Last 5 Encounters:  03/02/20 193 lb 12.8 oz (87.9 kg)  01/05/20 187 lb 6.4 oz (85 kg)  12/28/19 189 lb 4 oz (85.8 kg)  12/23/19 189 lb 3.2 oz (85.8 kg)  09/30/19 185 lb 6.4 oz (84.1 kg)    BMI Readings from Last 5 Encounters:  03/02/20 28.62 kg/m  01/05/20 29.35 kg/m  12/28/19 29.42 kg/m  12/23/19 29.63 kg/m  09/30/19 29.04 kg/m     Physical Exam Vitals and nursing note reviewed.  Constitutional:      General: He is not in acute distress.    Appearance: Normal appearance. He is obese.  HENT:     Head: Normocephalic and atraumatic.     Right Ear: Hearing and external ear normal.     Left Ear: Hearing and external ear normal.     Nose: No mucosal edema.     Right Turbinates: Not enlarged.     Left Turbinates: Not enlarged.  Eyes:     Pupils: Pupils are equal, round, and reactive to light.  Cardiovascular:     Rate and Rhythm: Normal rate and regular rhythm.     Pulses: Normal pulses.     Heart sounds: Normal heart sounds. No murmur.  Pulmonary:     Effort: Pulmonary effort is normal.     Breath sounds: No  decreased breath sounds, wheezing or rales.  Musculoskeletal:     Cervical back: Normal range of motion.     Right lower leg: No edema.     Left lower leg: No edema.  Lymphadenopathy:     Cervical: No cervical adenopathy.  Skin:    General: Skin is warm and dry.     Capillary Refill: Capillary refill takes less than 2 seconds.     Findings: No erythema or rash.  Neurological:     General: No focal deficit present.     Mental Status: He is alert and oriented to person, place, and time.     Motor: No weakness.     Coordination: Coordination normal.     Gait: Gait is intact. Gait (Tolerated walk in office) normal.  Psychiatric:        Mood and Affect: Mood normal.        Behavior: Behavior normal. Behavior is cooperative.        Thought Content: Thought content normal.        Judgment: Judgment normal.       Assessment & Plan:   GERD (gastroesophageal reflux disease) Patient reports occasional acid reflux symptoms about once every 2 months Patient reports this is  directly related to dietary choices specifically spicy foods He has over-the-counter Nexium that he will use in those instances as well as Tums  Plan: Patient to let us know if he starts having more active reflux such as 1-2 events a month If this occurs then we will consider putting him on a controller therapy such as PPI Encourage patient to avoid dietary choices that cause acid reflux  ILD (interstitial lung disease) ([place]) Reviewed pulmonary function testing today Walk today in office without any oxygen desaturations Reviewed connective tissue lab work as well as CT imaging from December/2020 Discussed appointment with Dr. 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 Chase Caller in an ILD clinic spot Patient to contact our office if he has worsening symptoms    Healthcare maintenance Plan: We will obtain pneumonia vaccine records from primary care We recommend the COVID-19 vaccine  Pulmonary nodule Completed follow-up with Dr. Lamonte Sakai virtually, PET scan negative No further work-up needed at this time    Return in about 3 months (around 06/02/2020), or if symptoms worsen or fail to improve, for Follow up with Dr. Purnell Shoemaker, After Chest CT.   Lauraine Rinne, NP 03/02/2020   This appointment required 42 minutes of patient care (this includes precharting, chart review, review of results, face-to-face care, etc.).

## 2020-03-02 NOTE — Assessment & Plan Note (Addendum)
Reviewed pulmonary function testing today Walk today in office without any oxygen desaturations Reviewed connective tissue lab work as well as CT imaging from December/2020 Discussed appointment with Dr. 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 Chase Caller in an ILD clinic spot Patient to contact our office if he has worsening symptoms

## 2020-03-02 NOTE — Progress Notes (Signed)
Spirometry and Dlco done today. 

## 2020-03-02 NOTE — Assessment & Plan Note (Signed)
Completed follow-up with Dr. Lamonte Sakai virtually, PET scan negative No further work-up needed at this time

## 2020-03-02 NOTE — Assessment & Plan Note (Signed)
Plan: We will obtain pneumonia vaccine records from primary care We recommend the COVID-19 vaccine

## 2020-03-25 ENCOUNTER — Telehealth: Payer: Self-pay

## 2020-03-25 DIAGNOSIS — K760 Fatty (change of) liver, not elsewhere classified: Secondary | ICD-10-CM

## 2020-03-25 NOTE — Telephone Encounter (Signed)
-----   Message from Marlon Pel, RN sent at 03/25/2020  4:21 PM EDT ----- Kurt Lloyd patient ----- Message ----- From: Marlon Pel, RN Sent: 03/25/2020   8:51 AM EDT To: Marlon Pel, RN   ----- Message ----- From: Hughie Closs, RN Sent: 03/25/2020 To: Hughie Closs, RN  Order 3 month repeat MRI-abd for cirrhotic liver = 04/03/20

## 2020-03-28 NOTE — Telephone Encounter (Signed)
You have been scheduled for an MRI at [facility] on 04/11/20. Your appointment time is 10 am. Please arrive 30 minutes prior to your appointment time for registration purposes. Please make certain not to have anything to eat or drink 6 hours prior to your test. In addition, if you have any metal in your body, have a pacemaker or defibrillator, please be sure to let your ordering physician know. This test typically takes 45 minutes to 1 hour to complete. Should you need to reschedule, please call [phone] to do so.  The patient has been notified of this information and all questions answered.

## 2020-03-28 NOTE — Telephone Encounter (Signed)
Left message on machine to call back  

## 2020-03-28 NOTE — Telephone Encounter (Signed)
Patient returned your call, please call patient one more time.   

## 2020-04-11 ENCOUNTER — Ambulatory Visit (HOSPITAL_COMMUNITY)
Admission: RE | Admit: 2020-04-11 | Discharge: 2020-04-11 | Disposition: A | Discharge status: Home / Self Care | Payer: Federal, State, Local not specified - PPO | Source: Ambulatory Visit | Attending: Physician Assistant | Admitting: Physician Assistant

## 2020-04-11 ENCOUNTER — Other Ambulatory Visit: Payer: Self-pay

## 2020-04-11 DIAGNOSIS — K760 Fatty (change of) liver, not elsewhere classified: Secondary | ICD-10-CM

## 2020-04-11 MED ORDER — GADOBUTROL 1 MMOL/ML IV SOLN
9.0000 mL | Freq: Once | INTRAVENOUS | Status: AC | PRN
  Administered 2020-04-11: 9 mL via INTRAVENOUS

## 2020-04-12 ENCOUNTER — Other Ambulatory Visit: Payer: Self-pay | Admitting: *Deleted

## 2020-04-13 ENCOUNTER — Inpatient Hospital Stay: Payer: Federal, State, Local not specified - PPO

## 2020-04-13 ENCOUNTER — Inpatient Hospital Stay: Payer: Federal, State, Local not specified - PPO | Attending: Hematology

## 2020-04-13 ENCOUNTER — Other Ambulatory Visit: Payer: Self-pay

## 2020-04-13 LAB — CBC WITH DIFFERENTIAL (CANCER CENTER ONLY)
Abs Immature Granulocytes: 0.03 10*3/uL (ref 0.00–0.07)
Basophils Absolute: 0.1 10*3/uL (ref 0.0–0.1)
Basophils Relative: 1 %
Eosinophils Absolute: 0.4 10*3/uL (ref 0.0–0.5)
Eosinophils Relative: 4 %
HCT: 38.4 % — ABNORMAL LOW (ref 39.0–52.0)
Hemoglobin: 14 g/dL (ref 13.0–17.0)
Immature Granulocytes: 0 %
Lymphocytes Relative: 34 %
Lymphs Abs: 3.2 10*3/uL (ref 0.7–4.0)
MCH: 34.7 pg — ABNORMAL HIGH (ref 26.0–34.0)
MCHC: 36.5 g/dL — ABNORMAL HIGH (ref 30.0–36.0)
MCV: 95.3 fL (ref 80.0–100.0)
Monocytes Absolute: 1 10*3/uL (ref 0.1–1.0)
Monocytes Relative: 11 %
Neutro Abs: 4.7 10*3/uL (ref 1.7–7.7)
Neutrophils Relative %: 50 %
Platelet Count: 386 10*3/uL (ref 150–400)
RBC: 4.03 MIL/uL — ABNORMAL LOW (ref 4.22–5.81)
RDW: 12.7 % (ref 11.5–15.5)
WBC Count: 9.5 10*3/uL (ref 4.0–10.5)
nRBC: 0 % (ref 0.0–0.2)

## 2020-04-13 LAB — IRON AND TIBC
Iron: 161 ug/dL (ref 42–163)
Saturation Ratios: 64 % — ABNORMAL HIGH (ref 20–55)
TIBC: 250 ug/dL (ref 202–409)
UIBC: 89 ug/dL — ABNORMAL LOW (ref 117–376)

## 2020-04-13 LAB — CMP (CANCER CENTER ONLY)
ALT: 21 U/L (ref 0–44)
AST: 19 U/L (ref 15–41)
Albumin: 3.8 g/dL (ref 3.5–5.0)
Alkaline Phosphatase: 75 U/L (ref 38–126)
Anion gap: 8 (ref 5–15)
BUN: 10 mg/dL (ref 8–23)
CO2: 27 mmol/L (ref 22–32)
Calcium: 8.9 mg/dL (ref 8.9–10.3)
Chloride: 104 mmol/L (ref 98–111)
Creatinine: 1.03 mg/dL (ref 0.61–1.24)
GFR, Est AFR Am: >60 mL/min (ref >60)
GFR, Estimated: >60 mL/min (ref >60)
Glucose, Bld: 261 mg/dL — ABNORMAL HIGH (ref 70–99)
Potassium: 4.1 mmol/L (ref 3.5–5.1)
Sodium: 139 mmol/L (ref 135–145)
Total Bilirubin: 1.4 mg/dL — ABNORMAL HIGH (ref 0.3–1.2)
Total Protein: 6.9 g/dL (ref 6.5–8.1)

## 2020-04-13 LAB — FERRITIN: Ferritin: 460 ng/mL — ABNORMAL HIGH (ref 24–336)

## 2020-04-13 NOTE — Progress Notes (Signed)
Kurt Lloyd presents today for phlebotomy per MD orders. Phlebotomy procedure started at 0955 and ended at 1005 500 grams removed via 16 G kit to [place]. (RAC attempt was unsuccessful).  Patient observed for 30 minutes after procedure without any incident. Patient tolerated procedure well. Diet and nutrition offered.

## 2020-04-13 NOTE — Patient Instructions (Signed)

## 2020-05-03 ENCOUNTER — Ambulatory Visit
Admission: RE | Admit: 2020-05-03 | Discharge: 2020-05-03 | Disposition: A | Discharge status: Home / Self Care | Payer: Federal, State, Local not specified - PPO | Source: Ambulatory Visit | Attending: Pulmonary Disease | Admitting: Pulmonary Disease

## 2020-05-03 ENCOUNTER — Other Ambulatory Visit: Payer: Self-pay

## 2020-05-03 DIAGNOSIS — J849 Interstitial pulmonary disease, unspecified: Secondary | ICD-10-CM

## 2020-06-08 ENCOUNTER — Inpatient Hospital Stay: Payer: Federal, State, Local not specified - PPO | Attending: Hematology

## 2020-06-08 ENCOUNTER — Other Ambulatory Visit: Payer: Self-pay | Admitting: *Deleted

## 2020-06-08 ENCOUNTER — Inpatient Hospital Stay: Payer: Federal, State, Local not specified - PPO

## 2020-06-08 ENCOUNTER — Other Ambulatory Visit: Payer: Self-pay

## 2020-06-08 ENCOUNTER — Inpatient Hospital Stay: Payer: Federal, State, Local not specified - PPO | Admitting: Hematology

## 2020-06-08 DIAGNOSIS — E119 Type 2 diabetes mellitus without complications: Secondary | ICD-10-CM | POA: Diagnosis not present

## 2020-06-08 LAB — CBC WITH DIFFERENTIAL (CANCER CENTER ONLY)
Abs Immature Granulocytes: 0.03 10*3/uL (ref 0.00–0.07)
Basophils Absolute: 0.2 10*3/uL — ABNORMAL HIGH (ref 0.0–0.1)
Basophils Relative: 2 %
Eosinophils Absolute: 0.4 10*3/uL (ref 0.0–0.5)
Eosinophils Relative: 5 %
HCT: 37.1 % — ABNORMAL LOW (ref 39.0–52.0)
Hemoglobin: 13.7 g/dL (ref 13.0–17.0)
Immature Granulocytes: 0 %
Lymphocytes Relative: 31 %
Lymphs Abs: 2.5 10*3/uL (ref 0.7–4.0)
MCH: 34.7 pg — ABNORMAL HIGH (ref 26.0–34.0)
MCHC: 36.9 g/dL — ABNORMAL HIGH (ref 30.0–36.0)
MCV: 93.9 fL (ref 80.0–100.0)
Monocytes Absolute: 0.9 10*3/uL (ref 0.1–1.0)
Monocytes Relative: 11 %
Neutro Abs: 4.2 10*3/uL (ref 1.7–7.7)
Neutrophils Relative %: 51 %
Platelet Count: 330 10*3/uL (ref 150–400)
RBC: 3.95 MIL/uL — ABNORMAL LOW (ref 4.22–5.81)
RDW: 12.6 % (ref 11.5–15.5)
WBC Count: 8.1 10*3/uL (ref 4.0–10.5)
nRBC: 0 % (ref 0.0–0.2)

## 2020-06-08 LAB — FERRITIN: Ferritin: 526 ng/mL — ABNORMAL HIGH (ref 24–336)

## 2020-06-08 NOTE — Patient Instructions (Signed)
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
## 2020-06-08 NOTE — Progress Notes (Signed)
0954 Phlebotomy started with 18G L AC. Pt tolerated well. Nutrition offered and accepted. 1003 Phlebotomy completed. 507g removed. Pt will wait his 30 minutes observation time.

## 2020-06-09 ENCOUNTER — Telehealth: Payer: Self-pay | Admitting: Hematology

## 2020-06-09 NOTE — Telephone Encounter (Signed)
Scheduled per 06/30 los, patient has been called and voicemail was left. 

## 2020-08-03 ENCOUNTER — Other Ambulatory Visit: Payer: Federal, State, Local not specified - PPO

## 2020-08-03 ENCOUNTER — Other Ambulatory Visit: Payer: Self-pay

## 2020-08-03 ENCOUNTER — Inpatient Hospital Stay: Payer: Federal, State, Local not specified - PPO | Attending: Hematology

## 2020-08-03 ENCOUNTER — Inpatient Hospital Stay: Payer: Federal, State, Local not specified - PPO

## 2020-08-03 LAB — CBC WITH DIFFERENTIAL/PLATELET
Abs Immature Granulocytes: 0.03 10*3/uL (ref 0.00–0.07)
Basophils Absolute: 0.1 10*3/uL (ref 0.0–0.1)
Basophils Relative: 1 %
Eosinophils Absolute: 0.6 10*3/uL — ABNORMAL HIGH (ref 0.0–0.5)
Eosinophils Relative: 5 %
HCT: 37.8 % — ABNORMAL LOW (ref 39.0–52.0)
Hemoglobin: 14.1 g/dL (ref 13.0–17.0)
Immature Granulocytes: 0 %
Lymphocytes Relative: 29 %
Lymphs Abs: 3.3 10*3/uL (ref 0.7–4.0)
MCH: 35.8 pg — ABNORMAL HIGH (ref 26.0–34.0)
MCHC: 37.3 g/dL — ABNORMAL HIGH (ref 30.0–36.0)
MCV: 95.9 fL (ref 80.0–100.0)
Monocytes Absolute: 1 10*3/uL (ref 0.1–1.0)
Monocytes Relative: 9 %
Neutro Abs: 6.3 10*3/uL (ref 1.7–7.7)
Neutrophils Relative %: 56 %
Platelets: 342 10*3/uL (ref 150–400)
RBC: 3.94 MIL/uL — ABNORMAL LOW (ref 4.22–5.81)
RDW: 12.7 % (ref 11.5–15.5)
WBC: 11.3 10*3/uL — ABNORMAL HIGH (ref 4.0–10.5)
nRBC: 0 % (ref 0.0–0.2)

## 2020-08-03 LAB — FERRITIN: Ferritin: 426 ng/mL — ABNORMAL HIGH (ref 24–336)

## 2020-08-03 NOTE — Progress Notes (Signed)
Kurt Lloyd presents today for phlebotomy per MD orders. 16 g. To L antecubital without success. Phlebotomy procedure started at 0934 and ended at 0939. 517 grams removed.  16 g. Phlebotomy kit to R antecubital, needle removed intact.   Patient observed for 30 minutes after procedure without any incident. Patient tolerated procedure well, given po fluids.

## 2020-08-03 NOTE — Patient Instructions (Signed)

## 2020-08-07 LAB — PULMONARY FUNCTION TEST
DL/VA % pred: 73 %
DL/VA: 3.11 ml/min/mmHg/L
DLCO cor % pred: 68 %
DLCO cor: 18.28 ml/min/mmHg
DLCO unc % pred: 69 %
DLCO unc: 18.63 ml/min/mmHg
FEF 25-75 Pre: 3.1 L/sec
FEF2575-%Pred-Pre: 109 %
FEV1-%Pred-Pre: 95 %
FEV1-Pre: 3.28 L
FEV1FVC-%Pred-Pre: 104 %
FEV6-%Pred-Pre: 94 %
FEV6-Pre: 4.09 L
FEV6FVC-%Pred-Pre: 103 %
FVC-%Pred-Pre: 90 %
FVC-Pre: 4.14 L
Pre FEV1/FVC ratio: 79 %
Pre FEV6/FVC Ratio: 99 %

## 2020-08-30 ENCOUNTER — Other Ambulatory Visit (HOSPITAL_COMMUNITY)
Admission: RE | Admit: 2020-08-30 | Discharge: 2020-08-30 | Disposition: A | Discharge status: Home / Self Care | Payer: Federal, State, Local not specified - PPO | Source: Ambulatory Visit | Attending: Pulmonary Disease | Admitting: Pulmonary Disease

## 2020-08-30 DIAGNOSIS — Z20822 Contact with and (suspected) exposure to covid-19: Secondary | ICD-10-CM | POA: Insufficient documentation

## 2020-08-30 DIAGNOSIS — Z01812 Encounter for preprocedural laboratory examination: Secondary | ICD-10-CM | POA: Diagnosis present

## 2020-08-30 LAB — SARS CORONAVIRUS 2 (TAT 6-24 HRS): SARS Coronavirus 2: NEGATIVE

## 2020-09-02 ENCOUNTER — Ambulatory Visit (INDEPENDENT_AMBULATORY_CARE_PROVIDER_SITE_OTHER): Payer: Federal, State, Local not specified - PPO | Admitting: Internal Medicine

## 2020-09-02 ENCOUNTER — Other Ambulatory Visit: Payer: Self-pay

## 2020-09-02 DIAGNOSIS — J849 Interstitial pulmonary disease, unspecified: Secondary | ICD-10-CM

## 2020-09-02 LAB — PULMONARY FUNCTION TEST
DL/VA % pred: 76 %
DL/VA: 3.23 ml/min/mmHg/L
DLCO cor % pred: 72 %
DLCO cor: 19.31 ml/min/mmHg
DLCO unc % pred: 71 %
DLCO unc: 19.04 ml/min/mmHg
FEF 25-75 Pre: 3.43 L/sec
FEF2575-%Pred-Pre: 121 %
FEV1-%Pred-Pre: 97 %
FEV1-Pre: 3.35 L
FEV1FVC-%Pred-Pre: 106 %
FEV6-%Pred-Pre: 95 %
FEV6-Pre: 4.14 L
FEV6FVC-%Pred-Pre: 104 %
FVC-%Pred-Pre: 91 %
FVC-Pre: 4.16 L
Pre FEV1/FVC ratio: 81 %
Pre FEV6/FVC Ratio: 100 %

## 2020-09-02 NOTE — Progress Notes (Signed)
Spirometry and Dlco done today. 

## 2020-09-28 ENCOUNTER — Other Ambulatory Visit: Payer: Self-pay

## 2020-09-28 ENCOUNTER — Inpatient Hospital Stay: Payer: Federal, State, Local not specified - PPO

## 2020-09-28 ENCOUNTER — Inpatient Hospital Stay: Payer: Federal, State, Local not specified - PPO | Attending: Hematology

## 2020-09-28 LAB — CBC WITH DIFFERENTIAL/PLATELET
Abs Immature Granulocytes: 0.02 10*3/uL (ref 0.00–0.07)
Basophils Absolute: 0.1 10*3/uL (ref 0.0–0.1)
Basophils Relative: 2 %
Eosinophils Absolute: 0.5 10*3/uL (ref 0.0–0.5)
Eosinophils Relative: 6 %
HCT: 41.8 % (ref 39.0–52.0)
Hemoglobin: 15.3 g/dL (ref 13.0–17.0)
Immature Granulocytes: 0 %
Lymphocytes Relative: 28 %
Lymphs Abs: 2.7 10*3/uL (ref 0.7–4.0)
MCH: 35.3 pg — ABNORMAL HIGH (ref 26.0–34.0)
MCHC: 36.6 g/dL — ABNORMAL HIGH (ref 30.0–36.0)
MCV: 96.3 fL (ref 80.0–100.0)
Monocytes Absolute: 0.8 10*3/uL (ref 0.1–1.0)
Monocytes Relative: 8 %
Neutro Abs: 5.4 10*3/uL (ref 1.7–7.7)
Neutrophils Relative %: 56 %
Platelets: 196 10*3/uL (ref 150–400)
RBC: 4.34 MIL/uL (ref 4.22–5.81)
RDW: 13 % (ref 11.5–15.5)
WBC: 9.6 10*3/uL (ref 4.0–10.5)
nRBC: 0 % (ref 0.0–0.2)

## 2020-09-28 LAB — IRON AND TIBC
Iron: 140 ug/dL (ref 42–163)
Saturation Ratios: 47 % (ref 20–55)
TIBC: 298 ug/dL (ref 202–409)
UIBC: 157 ug/dL (ref 117–376)

## 2020-09-28 LAB — FERRITIN: Ferritin: 434 ng/mL — ABNORMAL HIGH (ref 24–336)

## 2020-09-28 NOTE — Patient Instructions (Signed)

## 2020-09-28 NOTE — Progress Notes (Signed)
Patient presented today for phlebotomy per MD order. A 16 gauge needle was placed in the Left AC and 512ml of blood was removed.  The needle was removed fully intact.  The patient stayed 30 minutes post observation, snack and beverage provided.  Patient ambulated from facility in stable condition.  VSS.

## 2020-11-23 ENCOUNTER — Inpatient Hospital Stay: Payer: Federal, State, Local not specified - PPO | Admitting: Hematology

## 2020-11-23 ENCOUNTER — Inpatient Hospital Stay: Payer: Federal, State, Local not specified - PPO | Attending: Hematology

## 2020-11-23 ENCOUNTER — Other Ambulatory Visit: Payer: Self-pay

## 2020-11-23 ENCOUNTER — Other Ambulatory Visit: Payer: Self-pay | Admitting: *Deleted

## 2020-11-23 ENCOUNTER — Inpatient Hospital Stay: Payer: Federal, State, Local not specified - PPO

## 2020-11-23 LAB — FERRITIN: Ferritin: 279 ng/mL (ref 24–336)

## 2020-11-23 LAB — CMP (CANCER CENTER ONLY)
ALT: 18 U/L (ref 0–44)
AST: 17 U/L (ref 15–41)
Albumin: 4.2 g/dL (ref 3.5–5.0)
Alkaline Phosphatase: 71 U/L (ref 38–126)
Anion gap: 10 (ref 5–15)
BUN: 12 mg/dL (ref 8–23)
CO2: 24 mmol/L (ref 22–32)
Calcium: 9.3 mg/dL (ref 8.9–10.3)
Chloride: 104 mmol/L (ref 98–111)
Creatinine: 1.24 mg/dL (ref 0.61–1.24)
GFR, Estimated: >60 mL/min (ref >60)
Glucose, Bld: 248 mg/dL — ABNORMAL HIGH (ref 70–99)
Potassium: 4.4 mmol/L (ref 3.5–5.1)
Sodium: 138 mmol/L (ref 135–145)
Total Bilirubin: 1.7 mg/dL — ABNORMAL HIGH (ref 0.3–1.2)
Total Protein: 7.6 g/dL (ref 6.5–8.1)

## 2020-11-23 LAB — CBC WITH DIFFERENTIAL/PLATELET
Abs Immature Granulocytes: 0.03 10*3/uL (ref 0.00–0.07)
Basophils Absolute: 0.2 10*3/uL — ABNORMAL HIGH (ref 0.0–0.1)
Basophils Relative: 1 %
Eosinophils Absolute: 0.7 10*3/uL — ABNORMAL HIGH (ref 0.0–0.5)
Eosinophils Relative: 6 %
HCT: 41 % (ref 39.0–52.0)
Hemoglobin: 15.1 g/dL (ref 13.0–17.0)
Immature Granulocytes: 0 %
Lymphocytes Relative: 36 %
Lymphs Abs: 4.1 10*3/uL — ABNORMAL HIGH (ref 0.7–4.0)
MCH: 35.6 pg — ABNORMAL HIGH (ref 26.0–34.0)
MCHC: 36.8 g/dL — ABNORMAL HIGH (ref 30.0–36.0)
MCV: 96.7 fL (ref 80.0–100.0)
Monocytes Absolute: 1 10*3/uL (ref 0.1–1.0)
Monocytes Relative: 9 %
Neutro Abs: 5.3 10*3/uL (ref 1.7–7.7)
Neutrophils Relative %: 48 %
Platelets: 388 10*3/uL (ref 150–400)
RBC: 4.24 MIL/uL (ref 4.22–5.81)
RDW: 12.7 % (ref 11.5–15.5)
WBC: 11.4 10*3/uL — ABNORMAL HIGH (ref 4.0–10.5)
nRBC: 0 % (ref 0.0–0.2)

## 2020-11-23 NOTE — Progress Notes (Signed)
Patient presented today for a phlebotomy per MD order.  A 16 gauge needle was placed in the Left AC, 502 mls blood removed.  The needle was removed catheter fully intact.  Patient provided food and beverage and stayed for 30 min post observation period.  Patient discharged in stable condition without any complaints.

## 2020-11-23 NOTE — Patient Instructions (Signed)
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
## 2020-12-19 ENCOUNTER — Telehealth: Payer: Self-pay

## 2020-12-19 ENCOUNTER — Telehealth: Payer: Self-pay | Admitting: Hematology

## 2020-12-19 NOTE — Telephone Encounter (Signed)
Received call from pt. requesting that his Lab and Phlebotomy appt. scheduled this week on 12/22/2020 at 12 noon be changed to earlier in the morning at 8:00 am if possible.  Also, pt. has requested that all his other upcoming Lab and Phlebotomy appts. be rescheduled to earlier in the morning at 8:00 am.  Scheduling message sent with pt's. request.

## 2020-12-19 NOTE — Telephone Encounter (Signed)
Returned patient's phone call regarding rescheduling future appointments, appointments have been rescheduled.

## 2020-12-22 ENCOUNTER — Other Ambulatory Visit: Payer: Self-pay

## 2020-12-22 ENCOUNTER — Other Ambulatory Visit: Payer: Federal, State, Local not specified - PPO

## 2020-12-22 ENCOUNTER — Inpatient Hospital Stay: Payer: Federal, State, Local not specified - PPO | Attending: Hematology

## 2020-12-22 ENCOUNTER — Inpatient Hospital Stay: Payer: Federal, State, Local not specified - PPO

## 2020-12-22 LAB — CMP (CANCER CENTER ONLY)
ALT: 15 U/L (ref 0–44)
AST: 16 U/L (ref 15–41)
Albumin: 4.2 g/dL (ref 3.5–5.0)
Alkaline Phosphatase: 67 U/L (ref 38–126)
Anion gap: 9 (ref 5–15)
BUN: 13 mg/dL (ref 8–23)
CO2: 25 mmol/L (ref 22–32)
Calcium: 9.3 mg/dL (ref 8.9–10.3)
Chloride: 104 mmol/L (ref 98–111)
Creatinine: 1.36 mg/dL — ABNORMAL HIGH (ref 0.61–1.24)
GFR, Estimated: 59 mL/min — ABNORMAL LOW (ref >60)
Glucose, Bld: 223 mg/dL — ABNORMAL HIGH (ref 70–99)
Potassium: 4.3 mmol/L (ref 3.5–5.1)
Sodium: 138 mmol/L (ref 135–145)
Total Bilirubin: 1.6 mg/dL — ABNORMAL HIGH (ref 0.3–1.2)
Total Protein: 7.9 g/dL (ref 6.5–8.1)

## 2020-12-22 LAB — CBC WITH DIFFERENTIAL/PLATELET
Abs Immature Granulocytes: 0.03 10*3/uL (ref 0.00–0.07)
Basophils Absolute: 0.2 10*3/uL — ABNORMAL HIGH (ref 0.0–0.1)
Basophils Relative: 2 %
Eosinophils Absolute: 0.7 10*3/uL — ABNORMAL HIGH (ref 0.0–0.5)
Eosinophils Relative: 6 %
HCT: 40.5 % (ref 39.0–52.0)
Hemoglobin: 15.1 g/dL (ref 13.0–17.0)
Immature Granulocytes: 0 %
Lymphocytes Relative: 33 %
Lymphs Abs: 3.5 10*3/uL (ref 0.7–4.0)
MCH: 35.4 pg — ABNORMAL HIGH (ref 26.0–34.0)
MCHC: 37.3 g/dL — ABNORMAL HIGH (ref 30.0–36.0)
MCV: 94.8 fL (ref 80.0–100.0)
Monocytes Absolute: 1.1 10*3/uL — ABNORMAL HIGH (ref 0.1–1.0)
Monocytes Relative: 10 %
Neutro Abs: 5.1 10*3/uL (ref 1.7–7.7)
Neutrophils Relative %: 49 %
Platelets: 406 10*3/uL — ABNORMAL HIGH (ref 150–400)
RBC: 4.27 MIL/uL (ref 4.22–5.81)
RDW: 13.1 % (ref 11.5–15.5)
WBC: 10.5 10*3/uL (ref 4.0–10.5)
nRBC: 0 % (ref 0.0–0.2)

## 2020-12-22 LAB — IRON AND TIBC
Iron: 97 ug/dL (ref 42–163)
Saturation Ratios: 29 % (ref 20–55)
TIBC: 333 ug/dL (ref 202–409)
UIBC: 236 ug/dL (ref 117–376)

## 2020-12-22 LAB — FERRITIN: Ferritin: 232 ng/mL (ref 24–336)

## 2020-12-22 NOTE — Progress Notes (Signed)
Phlebotomy performed via left ac using 16g phlebotomy kit.  520gm removed over approximately 8 minutes.  Pt tolerated well.  Observed 30 minutes post procedure.  VSS.  Drink/Snack provided.

## 2020-12-22 NOTE — Patient Instructions (Signed)

## 2021-01-18 ENCOUNTER — Other Ambulatory Visit: Payer: Self-pay

## 2021-01-18 ENCOUNTER — Inpatient Hospital Stay: Payer: Federal, State, Local not specified - PPO | Attending: Hematology

## 2021-01-18 ENCOUNTER — Other Ambulatory Visit: Payer: Federal, State, Local not specified - PPO

## 2021-01-18 ENCOUNTER — Inpatient Hospital Stay: Payer: Federal, State, Local not specified - PPO

## 2021-01-18 LAB — IRON AND TIBC
Iron: 136 ug/dL (ref 42–163)
Saturation Ratios: 42 % (ref 20–55)
TIBC: 325 ug/dL (ref 202–409)
UIBC: 189 ug/dL (ref 117–376)

## 2021-01-18 LAB — CBC WITH DIFFERENTIAL/PLATELET
Abs Immature Granulocytes: 0.03 10*3/uL (ref 0.00–0.07)
Basophils Absolute: 0.2 10*3/uL — ABNORMAL HIGH (ref 0.0–0.1)
Basophils Relative: 2 %
Eosinophils Absolute: 0.6 10*3/uL — ABNORMAL HIGH (ref 0.0–0.5)
Eosinophils Relative: 6 %
HCT: 42.2 % (ref 39.0–52.0)
Hemoglobin: 15.5 g/dL (ref 13.0–17.0)
Immature Granulocytes: 0 %
Lymphocytes Relative: 38 %
Lymphs Abs: 4 10*3/uL (ref 0.7–4.0)
MCH: 35.3 pg — ABNORMAL HIGH (ref 26.0–34.0)
MCHC: 36.7 g/dL — ABNORMAL HIGH (ref 30.0–36.0)
MCV: 96.1 fL (ref 80.0–100.0)
Monocytes Absolute: 1.2 10*3/uL — ABNORMAL HIGH (ref 0.1–1.0)
Monocytes Relative: 11 %
Neutro Abs: 4.6 10*3/uL (ref 1.7–7.7)
Neutrophils Relative %: 43 %
Platelets: 395 10*3/uL (ref 150–400)
RBC: 4.39 MIL/uL (ref 4.22–5.81)
RDW: 13 % (ref 11.5–15.5)
WBC: 10.6 10*3/uL — ABNORMAL HIGH (ref 4.0–10.5)
nRBC: 0 % (ref 0.0–0.2)

## 2021-01-18 LAB — FERRITIN: Ferritin: 222 ng/mL (ref 24–336)

## 2021-01-18 NOTE — Patient Instructions (Signed)

## 2021-02-15 ENCOUNTER — Inpatient Hospital Stay: Payer: Federal, State, Local not specified - PPO | Attending: Hematology

## 2021-02-15 ENCOUNTER — Other Ambulatory Visit: Payer: Self-pay

## 2021-02-15 ENCOUNTER — Inpatient Hospital Stay: Payer: Federal, State, Local not specified - PPO

## 2021-02-15 ENCOUNTER — Other Ambulatory Visit: Payer: Federal, State, Local not specified - PPO

## 2021-02-15 LAB — CBC WITH DIFFERENTIAL/PLATELET
Abs Immature Granulocytes: 0.02 10*3/uL (ref 0.00–0.07)
Basophils Absolute: 0.1 10*3/uL (ref 0.0–0.1)
Basophils Relative: 2 %
Eosinophils Absolute: 0.4 10*3/uL (ref 0.0–0.5)
Eosinophils Relative: 4 %
HCT: 39.7 % (ref 39.0–52.0)
Hemoglobin: 14.7 g/dL (ref 13.0–17.0)
Immature Granulocytes: 0 %
Lymphocytes Relative: 35 %
Lymphs Abs: 3 10*3/uL (ref 0.7–4.0)
MCH: 35.1 pg — ABNORMAL HIGH (ref 26.0–34.0)
MCHC: 37 g/dL — ABNORMAL HIGH (ref 30.0–36.0)
MCV: 94.7 fL (ref 80.0–100.0)
Monocytes Absolute: 1 10*3/uL (ref 0.1–1.0)
Monocytes Relative: 11 %
Neutro Abs: 4.2 10*3/uL (ref 1.7–7.7)
Neutrophils Relative %: 48 %
Platelets: 397 10*3/uL (ref 150–400)
RBC: 4.19 MIL/uL — ABNORMAL LOW (ref 4.22–5.81)
RDW: 13.1 % (ref 11.5–15.5)
WBC: 8.6 10*3/uL (ref 4.0–10.5)
nRBC: 0 % (ref 0.0–0.2)

## 2021-02-15 LAB — IRON AND TIBC
Iron: 109 ug/dL (ref 42–163)
Saturation Ratios: 31 % (ref 20–55)
TIBC: 348 ug/dL (ref 202–409)
UIBC: 239 ug/dL (ref 117–376)

## 2021-02-15 LAB — FERRITIN: Ferritin: 152 ng/mL (ref 24–336)

## 2021-02-15 NOTE — Progress Notes (Signed)
Kurt Lloyd presents today for phlebotomy per MD orders. Phlebotomy procedure started at 0900 and ended at 0907. 506 grams removed via 16 G needle to [place]. IV needle removed intact. Patient observed for 30 minutes after procedure without any incident. Food and drink provided.  Patient tolerated procedure well.

## 2021-02-15 NOTE — Patient Instructions (Signed)
Therapeutic Phlebotomy Therapeutic phlebotomy is the planned removal of blood from a person's body for the purpose of treating a medical condition. The procedure is similar to donating blood. Usually, about a pint (470 mL, or 0.47 L) of blood is removed. The average adult has 9-12 pints (4.3-5.7 L) of blood in the body. Therapeutic phlebotomy may be used to treat the following medical conditions:  Hemochromatosis. This is a condition in which the blood contains too much iron.  Polycythemia vera. This is a condition in which the blood contains too many red blood cells.  Porphyria cutanea tarda. This is a disease in which an important part of hemoglobin is not made properly. It results in the buildup of abnormal amounts of porphyrins in the body.  Sickle cell disease. This is a condition in which the red blood cells form an abnormal crescent shape rather than a round shape. Tell a health care provider about:  Any allergies you have.  All medicines you are taking, including vitamins, herbs, eye drops, creams, and over-the-counter medicines.  Any problems you or family members have had with anesthetic medicines.  Any blood disorders you have.  Any surgeries you have had.  Any medical conditions you have.  Whether you are pregnant or may be pregnant. What are the risks? Generally, this is a safe procedure. However, problems may occur, including:  Nausea or light-headedness.  Low blood pressure (hypotension).  Soreness, bleeding, swelling, or bruising at the needle insertion site.  Infection. What happens before the procedure?  Follow instructions from your health care provider about eating or drinking restrictions.  Ask your health care provider about: ? Changing or stopping your regular medicines. This is especially important if you are taking diabetes medicines or blood thinners (anticoagulants). ? Taking medicines such as aspirin and ibuprofen. These medicines can thin your  blood. Do not take these medicines unless your health care provider tells you to take them. ? Taking over-the-counter medicines, vitamins, herbs, and supplements.  Wear clothing with sleeves that can be raised above the elbow.  Plan to have someone take you home from the hospital or clinic.  You may have a blood sample taken.  Your blood pressure, pulse rate, and breathing rate will be measured. What happens during the procedure?  To lower your risk of infection: ? Your health care team will wash or sanitize their hands. ? Your skin will be cleaned with an antiseptic.  You may be given a medicine to numb the area (local anesthetic).  A tourniquet will be placed on your arm.  A needle will be inserted into one of your veins.  Tubing and a collection bag will be attached to that needle.  Blood will flow through the needle and tubing into the collection bag.  The collection bag will be placed lower than your arm to allow gravity to help the flow of blood into the bag.  You may be asked to open and close your hand slowly and continually during the entire collection.  After the specified amount of blood has been removed from your body, the collection bag and tubing will be clamped.  The needle will be removed from your vein.  Pressure will be held on the site of the needle insertion to stop the bleeding.  A bandage (dressing) will be placed over the needle insertion site. The procedure may vary among health care providers and hospitals.   What happens after the procedure?  Your blood pressure, pulse rate, and breathing rate will   be measured after the procedure.  You will be encouraged to drink fluids.  Your recovery will be assessed and monitored.  You can return to your normal activities as told by your health care provider. Summary  Therapeutic phlebotomy is the planned removal of blood from a person's body for the purpose of treating a medical condition.  Therapeutic  phlebotomy may be used to treat hemochromatosis, polycythemia vera, porphyria cutanea tarda, or sickle cell disease.  In the procedure, a needle is inserted and about a pint (470 mL, or 0.47 L) of blood is removed. The average adult has 9-12 pints (4.3-5.7 L) of blood in the body.  This is generally a safe procedure, but it can sometimes cause problems such as nausea, light-headedness, or low blood pressure (hypotension). This information is not intended to replace advice given to you by your health care provider. Make sure you discuss any questions you have with your health care provider. Document Revised: 12/12/2017 Document Reviewed: 12/12/2017 Elsevier Patient Education  2021 [employer]  

## 2021-02-20 ENCOUNTER — Other Ambulatory Visit: Payer: Self-pay | Admitting: Physician Assistant

## 2021-02-20 DIAGNOSIS — R932 Abnormal findings on diagnostic imaging of liver and biliary tract: Secondary | ICD-10-CM

## 2021-03-15 ENCOUNTER — Inpatient Hospital Stay: Payer: Federal, State, Local not specified - PPO | Attending: Hematology

## 2021-03-15 ENCOUNTER — Other Ambulatory Visit: Payer: Self-pay

## 2021-03-15 ENCOUNTER — Inpatient Hospital Stay: Payer: Federal, State, Local not specified - PPO

## 2021-03-15 ENCOUNTER — Other Ambulatory Visit: Payer: Federal, State, Local not specified - PPO

## 2021-03-15 LAB — CBC WITH DIFFERENTIAL/PLATELET
Abs Immature Granulocytes: 0.01 10*3/uL (ref 0.00–0.07)
Basophils Absolute: 0.2 10*3/uL — ABNORMAL HIGH (ref 0.0–0.1)
Basophils Relative: 2 %
Eosinophils Absolute: 0.6 10*3/uL — ABNORMAL HIGH (ref 0.0–0.5)
Eosinophils Relative: 7 %
HCT: 37.4 % — ABNORMAL LOW (ref 39.0–52.0)
Hemoglobin: 14 g/dL (ref 13.0–17.0)
Immature Granulocytes: 0 %
Lymphocytes Relative: 30 %
Lymphs Abs: 2.4 10*3/uL (ref 0.7–4.0)
MCH: 35.5 pg — ABNORMAL HIGH (ref 26.0–34.0)
MCHC: 37.4 g/dL — ABNORMAL HIGH (ref 30.0–36.0)
MCV: 94.9 fL (ref 80.0–100.0)
Monocytes Absolute: 0.8 10*3/uL (ref 0.1–1.0)
Monocytes Relative: 9 %
Neutro Abs: 4.1 10*3/uL (ref 1.7–7.7)
Neutrophils Relative %: 52 %
Platelets: 387 10*3/uL (ref 150–400)
RBC: 3.94 MIL/uL — ABNORMAL LOW (ref 4.22–5.81)
RDW: 13.2 % (ref 11.5–15.5)
WBC: 8 10*3/uL (ref 4.0–10.5)
nRBC: 0 % (ref 0.0–0.2)

## 2021-03-15 LAB — CMP (CANCER CENTER ONLY)
ALT: 16 U/L (ref 0–44)
AST: 19 U/L (ref 15–41)
Albumin: 4.1 g/dL (ref 3.5–5.0)
Alkaline Phosphatase: 65 U/L (ref 38–126)
Anion gap: 14 (ref 5–15)
BUN: 11 mg/dL (ref 8–23)
CO2: 22 mmol/L (ref 22–32)
Calcium: 8.9 mg/dL (ref 8.9–10.3)
Chloride: 105 mmol/L (ref 98–111)
Creatinine: 1.12 mg/dL (ref 0.61–1.24)
GFR, Estimated: >60 mL/min (ref >60)
Glucose, Bld: 216 mg/dL — ABNORMAL HIGH (ref 70–99)
Potassium: 4.6 mmol/L (ref 3.5–5.1)
Sodium: 141 mmol/L (ref 135–145)
Total Bilirubin: 1.3 mg/dL — ABNORMAL HIGH (ref 0.3–1.2)
Total Protein: 7.4 g/dL (ref 6.5–8.1)

## 2021-03-15 LAB — IRON AND TIBC
Iron: 112 ug/dL (ref 42–163)
Saturation Ratios: 34 % (ref 20–55)
TIBC: 328 ug/dL (ref 202–409)
UIBC: 216 ug/dL (ref 117–376)

## 2021-03-15 LAB — FERRITIN: Ferritin: 109 ng/mL (ref 24–336)

## 2021-03-15 NOTE — Patient Instructions (Signed)

## 2021-04-11 ENCOUNTER — Emergency Department (HOSPITAL_COMMUNITY): Payer: Federal, State, Local not specified - PPO

## 2021-04-11 ENCOUNTER — Other Ambulatory Visit: Payer: Self-pay

## 2021-04-11 ENCOUNTER — Emergency Department (HOSPITAL_COMMUNITY)
Admission: EM | Admit: 2021-04-11 | Discharge: 2021-04-11 | Payer: Federal, State, Local not specified - PPO | Attending: Emergency Medicine | Admitting: Emergency Medicine

## 2021-04-11 ENCOUNTER — Encounter (HOSPITAL_COMMUNITY): Payer: Self-pay | Admitting: Emergency Medicine

## 2021-04-11 DIAGNOSIS — Z7982 Long term (current) use of aspirin: Secondary | ICD-10-CM | POA: Diagnosis not present

## 2021-04-11 DIAGNOSIS — I10 Essential (primary) hypertension: Secondary | ICD-10-CM | POA: Insufficient documentation

## 2021-04-11 DIAGNOSIS — J9851 Mediastinitis: Secondary | ICD-10-CM | POA: Insufficient documentation

## 2021-04-11 DIAGNOSIS — K223 Perforation of esophagus: Secondary | ICD-10-CM | POA: Diagnosis not present

## 2021-04-11 DIAGNOSIS — K219 Gastro-esophageal reflux disease without esophagitis: Secondary | ICD-10-CM | POA: Diagnosis not present

## 2021-04-11 DIAGNOSIS — Z8673 Personal history of transient ischemic attack (TIA), and cerebral infarction without residual deficits: Secondary | ICD-10-CM | POA: Diagnosis not present

## 2021-04-11 DIAGNOSIS — K2900 Acute gastritis without bleeding: Secondary | ICD-10-CM

## 2021-04-11 DIAGNOSIS — Z7984 Long term (current) use of oral hypoglycemic drugs: Secondary | ICD-10-CM | POA: Insufficient documentation

## 2021-04-11 DIAGNOSIS — E119 Type 2 diabetes mellitus without complications: Secondary | ICD-10-CM | POA: Insufficient documentation

## 2021-04-11 DIAGNOSIS — Z20822 Contact with and (suspected) exposure to covid-19: Secondary | ICD-10-CM | POA: Diagnosis not present

## 2021-04-11 DIAGNOSIS — R1084 Generalized abdominal pain: Secondary | ICD-10-CM | POA: Diagnosis present

## 2021-04-11 LAB — CBC WITH DIFFERENTIAL/PLATELET
Abs Immature Granulocytes: 0.01 10*3/uL (ref 0.00–0.07)
Basophils Absolute: 0 10*3/uL (ref 0.0–0.1)
Basophils Relative: 0 %
Eosinophils Absolute: 0 10*3/uL (ref 0.0–0.5)
Eosinophils Relative: 1 %
HCT: 42.9 % (ref 39.0–52.0)
Hemoglobin: 15.7 g/dL (ref 13.0–17.0)
Immature Granulocytes: 0 %
Lymphocytes Relative: 41 %
Lymphs Abs: 3.1 10*3/uL (ref 0.7–4.0)
MCH: 35.4 pg — ABNORMAL HIGH (ref 26.0–34.0)
MCHC: 36.6 g/dL — ABNORMAL HIGH (ref 30.0–36.0)
MCV: 96.6 fL (ref 80.0–100.0)
Monocytes Absolute: 0.3 10*3/uL (ref 0.1–1.0)
Monocytes Relative: 4 %
Neutro Abs: 4.1 10*3/uL (ref 1.7–7.7)
Neutrophils Relative %: 54 %
Platelets: 317 10*3/uL (ref 150–400)
RBC: 4.44 MIL/uL (ref 4.22–5.81)
RDW: 12.7 % (ref 11.5–15.5)
WBC: 7.5 10*3/uL (ref 4.0–10.5)
nRBC: 0 % (ref 0.0–0.2)

## 2021-04-11 LAB — COMPREHENSIVE METABOLIC PANEL
ALT: 16 U/L (ref 0–44)
AST: 23 U/L (ref 15–41)
Albumin: 3.7 g/dL (ref 3.5–5.0)
Alkaline Phosphatase: 59 U/L (ref 38–126)
Anion gap: 12 (ref 5–15)
BUN: 18 mg/dL (ref 8–23)
CO2: 22 mmol/L (ref 22–32)
Calcium: 9.2 mg/dL (ref 8.9–10.3)
Chloride: 103 mmol/L (ref 98–111)
Creatinine, Ser: 1.42 mg/dL — ABNORMAL HIGH (ref 0.61–1.24)
GFR, Estimated: 56 mL/min — ABNORMAL LOW (ref >60)
Glucose, Bld: 332 mg/dL — ABNORMAL HIGH (ref 70–99)
Potassium: 3.8 mmol/L (ref 3.5–5.1)
Sodium: 137 mmol/L (ref 135–145)
Total Bilirubin: 1.7 mg/dL — ABNORMAL HIGH (ref 0.3–1.2)
Total Protein: 6.9 g/dL (ref 6.5–8.1)

## 2021-04-11 LAB — I-STAT CHEM 8, ED
BUN: 21 mg/dL (ref 8–23)
Calcium, Ion: 1.22 mmol/L (ref 1.15–1.40)
Chloride: 102 mmol/L (ref 98–111)
Creatinine, Ser: 1.3 mg/dL — ABNORMAL HIGH (ref 0.61–1.24)
Glucose, Bld: 322 mg/dL — ABNORMAL HIGH (ref 70–99)
HCT: 40 % (ref 39.0–52.0)
Hemoglobin: 13.6 g/dL (ref 13.0–17.0)
Potassium: 3.8 mmol/L (ref 3.5–5.1)
Sodium: 138 mmol/L (ref 135–145)
TCO2: 24 mmol/L (ref 22–32)

## 2021-04-11 LAB — RESP PANEL BY RT-PCR (FLU A&B, COVID) ARPGX2
Influenza A by PCR: NEGATIVE
Influenza B by PCR: NEGATIVE
SARS Coronavirus 2 by RT PCR: NEGATIVE

## 2021-04-11 LAB — TROPONIN I (HIGH SENSITIVITY)
Troponin I (High Sensitivity): 6 ng/L (ref <18)
Troponin I (High Sensitivity): 6 ng/L (ref <18)

## 2021-04-11 MED ORDER — IOHEXOL 350 MG/ML SOLN
100.0000 mL | Freq: Once | INTRAVENOUS | Status: AC | PRN
  Administered 2021-04-11: 100 mL via INTRAVENOUS

## 2021-04-11 MED ORDER — SODIUM CHLORIDE 0.9 % IV SOLN: INTRAVENOUS | Status: DC

## 2021-04-11 MED ORDER — PANTOPRAZOLE SODIUM 40 MG IV SOLR: 40.0000 mg | Freq: Two times a day (BID) | INTRAVENOUS | Status: DC

## 2021-04-11 MED ORDER — PIPERACILLIN-TAZOBACTAM 3.375 G IVPB 30 MIN
3.3750 g | Freq: Once | INTRAVENOUS | Status: AC
  Administered 2021-04-11: 3.375 g via INTRAVENOUS
  Filled 2021-04-11: qty 50

## 2021-04-11 MED ORDER — SODIUM CHLORIDE 0.9 % IV SOLN
8.0000 mg/h | INTRAVENOUS | Status: DC
  Administered 2021-04-11: 8 mg/h via INTRAVENOUS
  Filled 2021-04-11 (×2): qty 80

## 2021-04-11 MED ORDER — MORPHINE SULFATE (PF) 4 MG/ML IV SOLN
4.0000 mg | Freq: Once | INTRAVENOUS | Status: AC
  Administered 2021-04-11: 4 mg via INTRAVENOUS
  Filled 2021-04-11: qty 1

## 2021-04-11 MED ORDER — SODIUM CHLORIDE 0.9 % IV SOLN: 8.0000 mg/h | INTRAVENOUS | Status: DC

## 2021-04-11 MED ORDER — SODIUM CHLORIDE 0.9 % IV SOLN
80.0000 mg | Freq: Once | INTRAVENOUS | Status: AC
  Administered 2021-04-11: 80 mg via INTRAVENOUS
  Filled 2021-04-11: qty 80

## 2021-04-11 MED ORDER — PANTOPRAZOLE SODIUM 40 MG IV SOLR
40.0000 mg | Freq: Two times a day (BID) | INTRAVENOUS | Status: DC
Start: 2021-04-14 — End: 2021-04-11

## 2021-04-11 MED ORDER — FENTANYL CITRATE (PF) 100 MCG/2ML IJ SOLN
50.0000 ug | Freq: Once | INTRAMUSCULAR | Status: AC
  Administered 2021-04-11: 50 ug via INTRAVENOUS
  Filled 2021-04-11: qty 2

## 2021-04-11 MED ORDER — DILTIAZEM LOAD VIA INFUSION: 15.0000 mg | Freq: Once | INTRAVENOUS | Status: DC

## 2021-04-11 MED ORDER — VANCOMYCIN HCL 1500 MG/300ML IV SOLN
1500.0000 mg | Freq: Once | INTRAVENOUS | Status: AC
  Administered 2021-04-11: 1500 mg via INTRAVENOUS
  Filled 2021-04-11: qty 300

## 2021-04-11 MED ORDER — DILTIAZEM HCL-DEXTROSE 125-5 MG/125ML-% IV SOLN (PREMIX): 5.0000 mg/h | INTRAVENOUS | Status: DC

## 2021-04-11 MED ORDER — SODIUM CHLORIDE 0.9 % IV SOLN: 80.0000 mg | Freq: Once | INTRAVENOUS | Status: DC

## 2021-04-11 MED ORDER — SODIUM CHLORIDE 0.9 % IV BOLUS
500.0000 mL | Freq: Once | INTRAVENOUS | Status: AC
  Administered 2021-04-11: 500 mL via INTRAVENOUS

## 2021-04-11 NOTE — ED Triage Notes (Addendum)
Pt brought to ED by [facility] with c/o abdominal pain and distention and N/V x2hrs. EMS reports giving Fentanyl 50 mcg IVP and Zofran 4mg  IVP en route to facility with positive effect on symptoms. Pt displaying no acute distress upon arrival to bed in facility.

## 2021-04-11 NOTE — Consult Note (Signed)
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  Patient states that he has had a longstanding history of GE reflux disease with intermittent difficulty swallowing with solid foods.  He underwent esophageal dilatation in the remote past but he has not seen a gastroenterologist for many years.  He states that food sometimes gets stuck, at times near the proximal esophagus and other times lower down in his chest.  He occasionally has to cough food up.  He also has complaints of chronic reflux.  He was in his usual state of health until yesterday evening when he tried swallowing his normal pills around 9 PM last night.  These pills got stuck so he subsequently ate a Butterfinger candy bar in an effort to push the pills through.  He immediately developed pain and started vomiting forcefully.  He then developed more severe epigastric and abdominal pain associated with difficulty taking a breath.  EMS was called and he was brought to the emergency department where he has remained afebrile and hemodynamically stable.  CTA of the chest was obtained by Dr. Randal Buba to rule out pulmonary embolus.  There was no sign of pulmonary embolus but  evidence of fluid and air with inflammation surrounding the distal esophagus at the GE junction.  Cardiothoracic surgical consultation was requested.   Past Medical History:  Diagnosis Date  . Allergy   . Blood transfusion   . Diabetes mellitus   . DVT (deep venous thrombosis) ([place])    right  . GERD (gastroesophageal reflux disease)   . Heart murmur   . Hereditary spherocytosis ([place])   . Hyperlipidemia   . Hypertension   . Stroke [facility])    x2 2007    Past Surgical History:  Procedure Laterality Date  . COLONOSCOPY    . SPLENECTOMY    . SVT ABLATION N/A 06/19/2019   Procedure: SVT ABLATION;  Surgeon: Evans Lance, MD;  Location: [facility];  Service: Cardiovascular;  Laterality: N/A;  . UMBILICAL HERNIA REPAIR    . VASECTOMY      Family History  Problem Relation Age of Onset  . Stroke Mother   . Lung cancer Father   . Hypertension Father   . Crohn's disease Daughter   . Colon cancer Neg Hx   . Esophageal cancer Neg Hx   . Rectal cancer Neg Hx   . Stomach cancer Neg Hx     Social History   Socioeconomic History  . Marital status: Married    Spouse name: Not on file  . Number of children: 2  . Years of education: Not on file  . Highest education level: Not on file  Occupational History  . Occupation: retired  Tobacco Use  . Smoking status: Never Smoker  . Smokeless tobacco: Never Used  Vaping Use  . Vaping Use: Never used  Substance and Sexual Activity  . Alcohol use: Yes    Comment: occasionally  . Drug use: No  . Sexual activity: Not on file  Other Topics Concern  . Not on file  Social History Narrative  . Not on file   Social Determinants of Health   Financial Resource Strain: Not on file  Food Insecurity: Not on file  Transportation Needs: Not on file  Physical Activity: Not on file  Stress: Not on file  Social Connections: Not on file  Intimate Partner Violence: Not on file    Prior to Admission medications   Medication Sig  Start Date End Date Taking? Authorizing Provider  acetaminophen (TYLENOL) 500 MG tablet Take 1,000 mg by mouth every 6 (six) hours as needed for moderate pain or headache.   Yes [provider]  aspirin EC 81 MG tablet Take 81 mg by mouth daily.   Yes [provider]  Cetirizine HCl (KLS ALLER-TEC PO) Take 1 tablet by mouth daily.   Yes [provider]  Cholecalciferol (VITAMIN D3) 50 MCG (2000 UT) TABS Take 2,000 Units by mouth daily.   Yes [provider]  EPINEPHrine 0.3 mg/0.3 mL IJ SOAJ injection Inject 0.3 mg into the muscle as needed for anaphylaxis (allergy shot).  01/03/19  Yes [provider]  fluticasone (FLONASE) 50 MCG/ACT nasal spray Place 1 spray into both nostrils daily.   Yes [provider]  metFORMIN (GLUCOPHAGE) 500 MG tablet Take 1,000 mg by mouth 2 (two) times a day.    Yes [provider]  Olopatadine HCl 0.6 % SOLN Place 2 sprays into the nose 2 (two) times daily.   Yes [provider]  OVER THE COUNTER MEDICATION Inject 1 Dose as directed once a week. Allergy Shots   Yes [provider]  OZEMPIC, 1 MG/DOSE, 2 MG/1.5ML SOPN Inject 1 mg into the skin every Wednesday. 03/23/20  Yes [provider]  Polyethyl Glycol-Propyl Glycol (SYSTANE) 0.4-0.3 % SOLN Place 1 drop into both eyes 3 (three) times daily as needed (dry/irritated eyes.).   Yes [provider]  rosuvastatin (CRESTOR) 40 MG tablet Take 40 mg by mouth daily.   Yes [provider]    Current Facility-Administered Medications  Medication Dose Route Frequency Provider Last Rate Last Admin  . 0.9 %  sodium chloride infusion   Intravenous Continuous Palumbo, April, MD      . pantoprazole (PROTONIX) 80 mg in sodium chloride 0.9 % 100 mL (0.8 mg/mL) infusion  8 mg/hr Intravenous Continuous Palumbo, April, MD      . pantoprazole (PROTONIX) 80 mg in sodium chloride 0.9 % 100 mL IVPB  80 mg Intravenous Once Palumbo,  April, MD 300 mL/hr at 04/11/21 0406 80 mg at 04/11/21 0406  . [START ON 04/14/2021] pantoprazole (PROTONIX) injection 40 mg  40 mg Intravenous Q12H Palumbo, April, MD      . vancomycin Alcus Dad) IVPB 1500 mg/300 mL  1,500 mg Intravenous Once Palumbo, April, MD 150 mL/hr at 04/11/21 0411 1,500 mg at 04/11/21 0411   Current Outpatient Medications  Medication Sig Dispense Refill  . acetaminophen (TYLENOL) 500 MG tablet Take 1,000 mg by mouth every 6 (six) hours as needed for moderate pain or headache.    Marland Kitchen aspirin EC 81 MG tablet Take 81 mg by mouth daily.    . Cetirizine HCl (  KLS ALLER-TEC PO) Take 1 tablet by mouth daily.    . Cholecalciferol (VITAMIN D3) 50 MCG (2000 UT) TABS Take 2,000 Units by mouth daily.    Marland Kitchen EPINEPHrine 0.3 mg/0.3 mL IJ SOAJ injection Inject 0.3 mg into the muscle as needed for anaphylaxis (allergy shot).     . fluticasone (FLONASE) 50 MCG/ACT nasal spray Place 1 spray into both nostrils daily.    . metFORMIN (GLUCOPHAGE) 500 MG tablet Take 1,000 mg by mouth 2 (two) times a day.     . Olopatadine HCl 0.6 % SOLN Place 2 sprays into the nose 2 (two) times daily.    Marland Kitchen OVER THE COUNTER MEDICATION Inject 1 Dose as directed once a week. Allergy Shots    . OZEMPIC, 1 MG/DOSE, 2 MG/1.5ML SOPN Inject 1 mg into the skin every Wednesday.    Vladimir Faster Glycol-Propyl Glycol (SYSTANE) 0.4-0.3 % SOLN Place 1 drop into both eyes 3 (three) times daily as needed (dry/irritated eyes.).    Marland Kitchen rosuvastatin (CRESTOR) 40 MG tablet Take 40 mg by mouth daily.      Allergies  Allergen Reactions  . Other Other (See Comments)    Grass,Shrubs,Trees, Dust, Pollen   . No Known Allergies       Review of Systems:  Per HPI.  Remainder non-contributory    Physical Exam:   BP (!) 153/76   Pulse 84   Temp 97.8 F (36.6 C) (Oral)   Resp 17   Ht 5\' 7"  (1.702 m)   Wt 88.9 kg   SpO2 93%   BMI 30.70 kg/m   General:  Obese male in no distress but somewhat  uncomfortable  HEENT:  Unremarkable   Neck:   no JVD, no bruits, no adenopathy   Chest:   clear to auscultation, symmetrical breath sounds, no wheezes, no rhonchi   CV:   RRR, no  murmur   Abdomen:  Obese, distended, moderately tender w/out rebound or guarding, old vertical laparotomy scar LUB with obvious ventral hernia  Extremities:  warm, well-perfused, no lower extremity edema  Rectal/GU  Deferred  Neuro:   Grossly non-focal and symmetrical throughout  Skin:   Clean and dry, no rashes, no breakdown  Diagnostic Tests:  Lab Results: Recent Labs    04/11/21 0054 04/11/21 0116  WBC 7.5  --   HGB 15.7 13.6  HCT 42.9 40.0  PLT 317  --    BMET:  Recent Labs    04/11/21 0054 04/11/21 0116  NA 137 138  K 3.8 3.8  CL 103 102  CO2 22  --   GLUCOSE 332* 322*  BUN 18 21  CREATININE 1.42* 1.30*  CALCIUM 9.2  --     CBG (last 3)  No results for input(s): GLUCAP in the last 72 hours. PT/INR:  No results for input(s): LABPROT, INR in the last 72 hours.  CXR:  DG ABDOMEN ACUTE WITH 1 VIEW CHEST  COMPARISON:  July 07, 2008  FINDINGS: There is no evidence of dilated bowel loops or free intraperitoneal air. A moderate severity stool burden is seen. No radiopaque calculi or other significant radiographic abnormality is seen. Heart size and mediastinal contours are within normal limits. Mild areas of atelectasis are seen within the bilateral lung bases.  IMPRESSION: 1. Moderate severity stool burden without evidence of bowel obstruction. 2. Mild bibasilar atelectasis.   Electronically Signed   By: Virgina Norfolk M.D.   On: 04/11/2021 01:58   CT ANGIOGRAPHY CHEST  CT ABDOMEN AND  PELVIS WITH CONTRAST  TECHNIQUE: Multidetector CT imaging of the chest was performed using the standard protocol during bolus administration of intravenous contrast. Multiplanar CT image reconstructions and MIPs were obtained to evaluate the vascular anatomy. Multidetector CT  imaging of the abdomen and pelvis was performed using the standard protocol during bolus administration of intravenous contrast.  CONTRAST:  123mL OMNIPAQUE IOHEXOL 350 MG/ML SOLN  COMPARISON:  Acute abdominal series 04/11/2021  FINDINGS: CTA CHEST FINDINGS  Cardiovascular: Satisfactory opacification of the pulmonary arteries. Mild motion artifact may limit detection of smaller segmental and subsegmental pulmonary artery emboli. No central or lobar filling defects are identified. Central pulmonary arteries are normal caliber. Normal heart size. No pericardial effusion. Few scattered coronary artery calcifications are present. The aorta is normal caliber.  Mediastinum/Nodes: Small amount of fluid and free air present in the lower middle mediastinum surrounding a circumferentially thickened distal thoracic esophagus with a suspected site of focal mural discontinuity identified at the level of the diaphragmatic hiatus (see key image series). No acute abnormality of the trachea or central airways. Normal thyroid gland and thoracic inlet. No pathologically enlarged mediastinal, hilar or axillary adenopathy.  Lungs/Pleura: Trace bilateral effusions with some adjacent passive atelectatic change and more dependent atelectasis posteriorly as well. Additional hypoventilatory changes may be accentuated by imaging during exhalation for the angiographic technique. Mild airways thickening and bronchitic changes with more bronchiectatic features towards the lung bases as well. There is some patchy airspace opacities present in the left lower lobe and lingula however which could reflect sequela of aspiration particularly given the findings of the esophagus. Redemonstration of a tubular appearing lesion in the anterior right middle lobe, with absent FDG avidity on prior PET-CT favoring a benign focus such as mucoid impacted airway. No pneumothorax.  Musculoskeletal: No acute osseous  abnormality or suspicious osseous lesion.  Review of the MIP images confirms the above findings.  CT ABDOMEN and PELVIS FINDINGS  Hepatobiliary: Redemonstration of a nodular hepatic surface contour, can be seen in the setting of intrinsic liver disease and cirrhotic change in this patient with known hemochromatosis. No concerning focal liver lesion. Gallbladder is normally distended without visible calcified gallstone or biliary ductal dilatation.  Pancreas: Some inflammation along superior margin of the pancreas is likely redistributed from the inflammation about the stomach. No focal peripancreatic inflammation or ductal dilatation is seen.  Spleen: Prior splenectomy.  Adrenals/Urinary Tract: Normal adrenals. Mild symmetric bilateral perinephric stranding, a nonspecific finding which may correlate with advanced age or decreased renal function. Kidneys are normally located with symmetric enhancement and excretion. No suspicious renal lesion, urolithiasis or hydronephrosis. Urinary bladder is unremarkable for the degree of distention.  Stomach/Bowel: Circumferential edematous thickening of the distal thoracic esophagus as well as the gastric antrum with a site of suspect perforation at the level of the diaphragmatic hiatus as detailed above. Extensive soft tissue gas, small volume of free fluid and phlegmonous changes noted in the lower middle mediastinum and upper abdomen and extending towards the superior margin of the pancreas as described above. Some fatty attenuation material (-100 HU) is noted in the duodenal bulb though unlikely to reflect a lipoma. Distal duodenum is unremarkable. No small bowel thickening or dilatation. No evidence of bowel obstruction. Normal appendix in the right lower quadrant. No colonic dilatation or wall thickening. Some mild stranding noted adjacent portion of the proximal descending colon is favored to be redistributed without  associated colonic wall thickening.  Vascular/Lymphatic: Atherosclerotic calcifications within the abdominal aorta and branch vessels. No  aneurysm or ectasia. No enlarged abdominopelvic lymph nodes. 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  Reproductive: The prostate and seminal vesicles are unremarkable.  Other: Free air, free fluid and phlegmonous changes centered upon the esophagus and stomach extending into the lesser sac as well. Some additional dependently layering free fluid is noted in the deep pelvis. Small amount of stranding and fluid in the pericolic gutters is likely to be redistributed. There is some fatty stranding involving the omental fat within associated complex multilobulated fat containing paraumbilical ventral hernia (5/58). No bowel containing hernia  Musculoskeletal: Multilevel degenerative changes are present in the imaged portions of the spine. No acute osseous abnormality or suspicious osseous lesion.  Review of the MIP images confirms the above findings.  IMPRESSION: 1. No evidence of pulmonary artery embolism. 2. Extensive thickening of the distal thoracic esophagus and gastric antrum with evidence of perforation. Suspected site of focal mural discontinuity is seen near the diaphragmatic hiatus. Extensive surrounding inflammatory features are noted in the lower middle mediastinum as well as in the upper abdomen including within the peritoneum, lesser sac and retroperitoneum. Stranding along the descending colon is favored to be distributed inflammation with additional trace free fluid in the pericolic gutters and deep pelvis. 3. Some patchy opacity and ground-glass in the lingula and left lower lobe with several thickened and fluid-filled airways could suggest sequela of aspiration. 4. Trace bilateral effusions and dependent  atelectasis. 5. Stable tubular lesion in the right middle lobe, suspect mucoid impaction of an airway given lack of FDG avidity on prior study. 6. Fat attenuation material at the duodenal bulb, could reflect ingestion of oily material, correlate with history. 7. Lobular fat containing paraumbilical ventral hernia with some stranding in the herniated fat and adjacent omental fat external to the hernia sac, correlate for point tenderness to assess for mild fatty strangulation. 8. Focal region of hazy mesenteric stranding in the mid abdomen compatible with sequela prior mesenteritis, unchanged from prior. 9. Lobular hepatic surface contour, may reflect some cirrhotic change in this patient with known hemochromatosis. 10. Prior splenectomy 11. Aortic Atherosclerosis (ICD10-I70.0). 12. Coronary artery calcifications are present. Please note that the presence of coronary artery calcium documents the presence of coronary artery disease, the severity of this disease and any potential stenosis cannot be assessed on this non-gated CT examination.  These results were called by telephone at the time of interpretation on 04/11/2021 at 2:57 am to provider [facility] , who verbally acknowledged these results.   Electronically Signed   By: Lovena Le M.D.   On: 04/11/2021 02:57   Impression:  Patient appears to have relatively localized esophageal perforation near the GE junction which developed following an episode of emesis initiated by difficulty swallowing and possible food impaction.  This has occurred in the patient with known history of longstanding GE reflux disease and possible esophageal stricture, further complicated by history of splenectomy in the remote past with chronic ventral incisional hernia.  At present the patient appears stable and is afebrile with normal white blood count.  He does have some abdominal tenderness but no peritoneal signs.  I have personally reviewed the  patient's CT scan which was performed without oral contrast but nonetheless demonstrates some air, fluid, and a considerable amount of inflammation surrounding the distal esophagus and GE junction.  There is no pleural effusion and no free intraperitoneal air.   Plan:  I discussed the patient's  circumstances and CT findings at length with my partner, Dr. Melodie Bouillon, who typically manages patients with esophageal surgical problems in our hospital.   Endoscopy with or without covered stent placement could be considered but may be complicated given significant likelihood of an underlying chronic esophageal stricture.  It is unlikely that percutaneous gastrostomy tube placement would be feasible and given the patient's complex history of abdominal surgery laparoscopy might not be possible either.  Open surgical exploration might require extensive left thoracoabdominal exposure.  Given the complex circumstances we both feel the patient would best be treated by immediate transfer to a tertiary care center.  In the meanwhile the patient should remain strictly NPO on intravenous antibiotics.  I have discussed matters at length with the patient and his wife at the bedside.  All questions have been answered.  Please call if we can be of further assistance.   I spent in excess of 90 minutes during the conduct of this hospital consultation and >50% of this time involved direct face-to-face encounter for counseling and/or coordination of the patient's care.    Valentina Gu. Roxy Manns, MD 04/11/2021 4:12 AM

## 2021-04-11 NOTE — ED Provider Notes (Addendum)
[place] EMERGENCY DEPARTMENT Provider Note   CSN: [id number] Arrival date & time: 04/11/21  0040     History Chief Complaint  Patient presents with  . Abdominal Pain  . Nausea    Kurt Lloyd is a 60 y.o. male.  The history is provided by the patient.  Abdominal Pain Pain location:  Generalized Pain quality: cramping   Pain radiates to:  Does not radiate Pain severity:  Severe Onset quality:  Sudden Duration:  2 hours Timing:  Constant Progression:  Unchanged Chronicity:  New Context: retching   Context: not alcohol use   Relieved by:  Nothing Worsened by:  Nothing Ineffective treatments:  None tried Associated symptoms: nausea, shortness of breath and vomiting   Associated symptoms: no anorexia, no chest pain, no diarrhea, no dysuria, no fatigue, no fever, no flatus and no hematuria   Shortness of breath:    Severity:  Severe   Onset quality:  Sudden   Duration:  2 hours   Timing:  Constant   Progression:  Unchanged Risk factors: being elderly   Risk factors: no alcohol abuse, no aspirin use and no NSAID use   Patient with GERD and hyperlipidemia presents with sudden onset nausea and vomiting and abdominal pain. Patient reports he was taking medicine at 930 pm and felt like the medicines got stuck,  Then he ate a Butterfinger bar to try to push it down.  He then left pain in this chest area like a tear. Patient also reports shortness of breath and is hyperventilating in the room.  Had a normal BM earlier in the day and passed gas within the hour.       Past Medical History:  Diagnosis Date  . Allergy   . Blood transfusion   . Diabetes mellitus   . DVT (deep venous thrombosis) ([place])    right  . GERD (gastroesophageal reflux disease)   . Heart murmur   . Hereditary spherocytosis ([place])   . Hyperlipidemia   . Hypertension   . Stroke [facility])    x2 2007    Patient Active Problem List   Diagnosis Date Noted  . ILD (interstitial lung  disease) ([place]) 03/02/2020  . Healthcare maintenance 03/02/2020  . GERD (gastroesophageal reflux disease) 03/02/2020  . Pulmonary nodule 01/28/2020  . Hereditary hemochromatosis ([place]) 09/30/2019  . Rib fractures 08/22/2019  . SVT (supraventricular tachycardia) ([place]) 06/15/2019  . Essential hypertension 12/16/2018  . Hyperlipidemia 12/16/2018  . Palpitations 12/16/2018  . Erectile dysfunction due to arterial insufficiency 12/08/2014    Past Surgical History:  Procedure Laterality Date  . COLONOSCOPY    . SPLENECTOMY    . SVT ABLATION N/A 06/19/2019   Procedure: SVT ABLATION;  Surgeon: Evans Lance, MD;  Location: [place] CV LAB;  Service: Cardiovascular;  Laterality: N/A;  . UMBILICAL HERNIA REPAIR    . VASECTOMY         Family History  Problem Relation Age of Onset  . Stroke Mother   . Lung cancer Father   . Hypertension Father   . Crohn's disease Daughter   . Colon cancer Neg Hx   . Esophageal cancer Neg Hx   . Rectal cancer Neg Hx   . Stomach cancer Neg Hx     Social History   Tobacco Use  . Smoking status: Never Smoker  . Smokeless tobacco: Never Used  Vaping Use  . Vaping Use: Never used  Substance Use Topics  . Alcohol use: Yes  Comment: occasionally  . Drug use: No    Home Medications Prior to Admission medications   Medication Sig Start Date End Date Taking? Authorizing Provider  aspirin EC 81 MG tablet Take 81 mg by mouth daily.    [provider]  EPINEPHrine 0.3 mg/0.3 mL IJ SOAJ injection Inject 0.3 mg into the muscle as needed for anaphylaxis (allergy shot).  01/03/19   [provider]  fexofenadine (ALLEGRA) 180 MG tablet Take 180 mg by mouth daily.    [provider]  fluticasone (FLONASE) 50 MCG/ACT nasal spray Place 1 spray into both nostrils daily.    [provider]  metFORMIN (GLUCOPHAGE) 500 MG tablet Take 1,000 mg by mouth 2 (two) times a day.     [provider]  Olopatadine HCl 0.6 %  SOLN Place 2 sprays into the nose 2 (two) times daily.    [provider]  OVER THE COUNTER MEDICATION Inject 1 Dose as directed once a week. Allergy Shots    [provider]  OZEMPIC, 0.25 OR 0.5 MG/DOSE, 2 MG/1.5ML SOPN Inject 0.25 mg into the skin once a week.  07/02/19   [provider]  OZEMPIC, 1 MG/DOSE, 2 MG/1.5ML SOPN Inject 1 mg into the skin once a week. 03/23/20   [provider]  Polyethyl Glycol-Propyl Glycol (SYSTANE) 0.4-0.3 % SOLN Place 1 drop into both eyes 3 (three) times daily as needed (dry/irritated eyes.).    [provider]  rosuvastatin (CRESTOR) 40 MG tablet Take 40 mg by mouth daily.    [provider]    Allergies    Other  Review of Systems   Review of Systems  Constitutional: Negative for fatigue and fever.  HENT: Negative for congestion.   Eyes: Negative for visual disturbance.  Respiratory: Positive for shortness of breath.   Cardiovascular: Negative for chest pain.  Gastrointestinal: Positive for abdominal pain, nausea and vomiting. Negative for anorexia, diarrhea and flatus.  Endocrine: Negative for polyuria.  Genitourinary: Negative for dysuria and hematuria.  Musculoskeletal: Positive for myalgias.  Neurological: Negative for dizziness.  Psychiatric/Behavioral: Negative for agitation.  All other systems reviewed and are negative.   Physical Exam Updated Vital Signs Temp 97.8 F (36.6 C) (Oral)   SpO2 100%   Physical Exam Vitals and nursing note reviewed.  Constitutional:      Appearance: Normal appearance. He is not diaphoretic.     Comments: Writhing in pain   HENT:     Head: Normocephalic and atraumatic.     Nose: Nose normal.  Eyes:     Conjunctiva/sclera: Conjunctivae normal.     Pupils: Pupils are equal, round, and reactive to light.  Cardiovascular:     Rate and Rhythm: Normal rate and regular rhythm.     Pulses: Normal pulses.     Heart sounds: Normal heart sounds.   Pulmonary:     Effort: No respiratory distress.     Breath sounds: No wheezing, rhonchi or rales.  Abdominal:     General: Bowel sounds are decreased. There is distension.     Tenderness: There is no abdominal tenderness. There is no guarding or rebound.  Musculoskeletal:        General: Normal range of motion.     Cervical back: Normal range of motion and neck supple.     Right lower leg: No edema.     Left lower leg: No edema.  Skin:    General: Skin is warm and dry.  Capillary Refill: Capillary refill takes less than 2 seconds.  Neurological:     Mental Status: He is alert and oriented to person, place, and time.     Deep Tendon Reflexes: Reflexes normal.  Psychiatric:        Mood and Affect: Mood is anxious.        Thought Content: Thought content normal.     ED Results / Procedures / Treatments   Labs (all labs ordered are listed, but only abnormal results are displayed) Results for orders placed or performed during the hospital encounter of 04/11/21  CBC with Differential/Platelet  Result Value Ref Range   WBC 7.5 4.0 - 10.5 K/uL   RBC 4.44 4.22 - 5.81 MIL/uL   Hemoglobin 15.7 13.0 - 17.0 g/dL   HCT 42.9 39.0 - 52.0 %   MCV 96.6 80.0 - 100.0 fL   MCH 35.4 (H) 26.0 - 34.0 pg   MCHC 36.6 (H) 30.0 - 36.0 g/dL   RDW 12.7 11.5 - 15.5 %   Platelets 317 150 - 400 K/uL   nRBC 0.0 0.0 - 0.2 %   Neutrophils Relative % 54 %   Neutro Abs 4.1 1.7 - 7.7 K/uL   Lymphocytes Relative 41 %   Lymphs Abs 3.1 0.7 - 4.0 K/uL   Monocytes Relative 4 %   Monocytes Absolute 0.3 0.1 - 1.0 K/uL   Eosinophils Relative 1 %   Eosinophils Absolute 0.0 0.0 - 0.5 K/uL   Basophils Relative 0 %   Basophils Absolute 0.0 0.0 - 0.1 K/uL   Immature Granulocytes 0 %   Abs Immature Granulocytes 0.01 0.00 - 0.07 K/uL  Comprehensive metabolic panel  Result Value Ref Range   Sodium 137 135 - 145 mmol/L   Potassium 3.8 3.5 - 5.1 mmol/L   Chloride 103 98 - 111 mmol/L   CO2 22 22 - 32 mmol/L    Glucose, Bld 332 (H) 70 - 99 mg/dL   BUN 18 8 - 23 mg/dL   Creatinine, Ser 1.42 (H) 0.61 - 1.24 mg/dL   Calcium 9.2 8.9 - 10.3 mg/dL   Total Protein 6.9 6.5 - 8.1 g/dL   Albumin 3.7 3.5 - 5.0 g/dL   AST 23 15 - 41 U/L   ALT 16 0 - 44 U/L   Alkaline Phosphatase 59 38 - 126 U/L   Total Bilirubin 1.7 (H) 0.3 - 1.2 mg/dL   GFR, Estimated 56 (L) >60 mL/min   Anion gap 12 5 - 15  I-stat chem 8, ED (not at [facility] or [facility])  Result Value Ref Range   Sodium 138 135 - 145 mmol/L   Potassium 3.8 3.5 - 5.1 mmol/L   Chloride 102 98 - 111 mmol/L   BUN 21 8 - 23 mg/dL   Creatinine, Ser 1.30 (H) 0.61 - 1.24 mg/dL   Glucose, Bld 322 (H) 70 - 99 mg/dL   Calcium, Ion 1.22 1.15 - 1.40 mmol/L   TCO2 24 22 - 32 mmol/L   Hemoglobin 13.6 13.0 - 17.0 g/dL   HCT 40.0 39.0 - 52.0 %  Troponin I (High Sensitivity)  Result Value Ref Range   Troponin I (High Sensitivity) 6 <18 ng/L   DG Abdomen Acute W/Chest  Result Date: 04/11/2021 CLINICAL DATA:  Abdominal pain and distension. EXAM: DG ABDOMEN ACUTE WITH 1 VIEW CHEST COMPARISON:  July 07, 2008 FINDINGS: There is no evidence of dilated bowel loops or free intraperitoneal air. A moderate severity stool burden is seen. No radiopaque calculi  or other significant radiographic abnormality is seen. Heart size and mediastinal contours are within normal limits. Mild areas of atelectasis are seen within the bilateral lung bases. IMPRESSION: 1. Moderate severity stool burden without evidence of bowel obstruction. 2. Mild bibasilar atelectasis. Electronically Signed   By: Virgina Norfolk M.D.   On: 04/11/2021 01:58    EKG See muse, no ischemia   Radiology DG Abdomen Acute W/Chest  Result Date: 04/11/2021 CLINICAL DATA:  Abdominal pain and distension. EXAM: DG ABDOMEN ACUTE WITH 1 VIEW CHEST COMPARISON:  July 07, 2008 FINDINGS: There is no evidence of dilated bowel loops or free intraperitoneal air. A moderate severity stool burden is seen. No radiopaque calculi or other  significant radiographic abnormality is seen. Heart size and mediastinal contours are within normal limits. Mild areas of atelectasis are seen within the bilateral lung bases. IMPRESSION: 1. Moderate severity stool burden without evidence of bowel obstruction. 2. Mild bibasilar atelectasis. Electronically Signed   By: Virgina Norfolk M.D.   On: 04/11/2021 01:58    Procedures Procedures   Medications Ordered in ED Medications  pantoprazole (PROTONIX) 80 mg in sodium chloride 0.9 % 100 mL IVPB (has no administration in time range)  pantoprazole (PROTONIX) 80 mg in sodium chloride 0.9 % 100 mL (0.8 mg/mL) infusion (has no administration in time range)  pantoprazole (PROTONIX) injection 40 mg (has no administration in time range)  morphine 4 MG/ML injection 4 mg (has no administration in time range)  vancomycin (VANCOREADY) IVPB 1500 mg/300 mL (has no administration in time range)  piperacillin-tazobactam (ZOSYN) IVPB 3.375 g (has no administration in time range)  sodium chloride 0.9 % bolus 500 mL (has no administration in time range)  0.9 %  sodium chloride infusion (has no administration in time range)  fentaNYL (SUBLIMAZE) injection 50 mcg (50 mcg Intravenous Given 04/11/21 0234)  iohexol (OMNIPAQUE) 350 MG/ML injection 100 mL (100 mLs Intravenous Contrast Given 04/11/21 0212)    ED Course  I have reviewed the triage vital signs and the nursing notes.  Pertinent labs & imaging results that were available during my care of the patient were reviewed by me and considered in my medical decision making (see chart for details).  Writhing in pain. More medication ordered.    305 case d/w Dr. Roxy Manns who will see the patient.    415 am has been seen by Dr. Roxy Manns.  Will need to be transferred to a tertiary care center.  If they have any questions they can call hime.   430 Case d/w Dr. Joya Gaskins at [facility], Thoracic surgery, declines due to capacity.    88 Awaiting surgeon at  [facility]   MDM Interpretation: labs, ECG and x-ray (normal troponin and white count, constipation on Xray ) Total time providing critical care: > 105 minutes (multiple drips and antibiotics ). This excludes time spent performing separately reportable procedures and services. Consults: thoracic surgery and multiple calls to other institutions   CRITICAL CARE Performed by: Savanah Bayles K Elya Tarquinio-Rasch Total critical care time: 120 minutes Critical care time was exclusive of separately billable procedures and treating other patients. Critical care was necessary to treat or prevent imminent or life-threatening deterioration. Critical care was time spent personally by me on the following activities: development of treatment plan with patient and/or surrogate as well as nursing, discussions with consultants, evaluation of patient's response to treatment, examination of patient, obtaining history from patient or surrogate, ordering and performing treatments and interventions, ordering and review of laboratory studies, ordering and  review of radiographic studies, pulse oximetry and re-evaluation of patient's condition.    Accepted to the ICU at [facility] by Dr. 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    Will need transfer to tertiary care center     [facility], Devyon Keator, MD 04/11/21 [phone]

## 2021-04-12 ENCOUNTER — Inpatient Hospital Stay: Payer: Federal, State, Local not specified - PPO

## 2021-04-12 ENCOUNTER — Other Ambulatory Visit: Payer: Federal, State, Local not specified - PPO

## 2021-04-16 LAB — CULTURE, BLOOD (ROUTINE X 2)
Culture: NO GROWTH
Culture: NO GROWTH
Special Requests: ADEQUATE

## 2021-05-10 ENCOUNTER — Inpatient Hospital Stay: Payer: Federal, State, Local not specified - PPO | Admitting: Hematology

## 2021-05-10 ENCOUNTER — Other Ambulatory Visit: Payer: Federal, State, Local not specified - PPO

## 2021-05-10 ENCOUNTER — Ambulatory Visit: Payer: Federal, State, Local not specified - PPO | Admitting: Hematology

## 2021-05-10 ENCOUNTER — Inpatient Hospital Stay: Payer: Federal, State, Local not specified - PPO

## 2021-05-16 ENCOUNTER — Telehealth: Payer: Self-pay

## 2021-05-16 NOTE — Telephone Encounter (Signed)
Call attempt per pt's request. Will try again.

## 2021-06-07 ENCOUNTER — Other Ambulatory Visit: Payer: Self-pay

## 2021-06-07 ENCOUNTER — Ambulatory Visit (HOSPITAL_COMMUNITY)
Admission: RE | Admit: 2021-06-07 | Discharge: 2021-06-07 | Disposition: A | Discharge status: Home / Self Care | Payer: Federal, State, Local not specified - PPO | Source: Ambulatory Visit | Attending: Nurse Practitioner | Admitting: Nurse Practitioner

## 2021-06-07 ENCOUNTER — Encounter (HOSPITAL_COMMUNITY): Payer: Self-pay | Admitting: Nurse Practitioner

## 2021-06-07 VITALS — BP 108/46 | HR 61 | Ht 67.0 in | Wt 181.6 lb

## 2021-06-07 DIAGNOSIS — Z79899 Other long term (current) drug therapy: Secondary | ICD-10-CM | POA: Diagnosis not present

## 2021-06-07 DIAGNOSIS — Z9689 Presence of other specified functional implants: Secondary | ICD-10-CM | POA: Insufficient documentation

## 2021-06-07 DIAGNOSIS — I4891 Unspecified atrial fibrillation: Secondary | ICD-10-CM

## 2021-06-07 DIAGNOSIS — Z992 Dependence on renal dialysis: Secondary | ICD-10-CM | POA: Insufficient documentation

## 2021-06-07 DIAGNOSIS — Z794 Long term (current) use of insulin: Secondary | ICD-10-CM | POA: Diagnosis not present

## 2021-06-07 DIAGNOSIS — N179 Acute kidney failure, unspecified: Secondary | ICD-10-CM | POA: Insufficient documentation

## 2021-06-07 DIAGNOSIS — I480 Paroxysmal atrial fibrillation: Secondary | ICD-10-CM

## 2021-06-07 DIAGNOSIS — K223 Perforation of esophagus: Secondary | ICD-10-CM | POA: Diagnosis not present

## 2021-06-07 NOTE — Progress Notes (Signed)
Primary Care Physician: Kurt Organ., MD Referring Physician: Dr. Gardenia Phlegm is a 60 y.o. male with a h/o prolonged 34 day stay at [facility] followed by a 7 day stay at a Rehab in [facility]. He was transferred from [facility] with an esophageal perforation (Boerhaave's syndrome). He required stenting across the perforation and required PEG tube placement. Hospitalization complicated by sepsis requiring pressor support , acute renal failure, continues on HD, liver abscess drainage on 5/24.   Also had afib in the hospital that was initially  treated with amiodarone, discontinued 2/2 bradycardia. Some intermittent afib seen during the remainder of hospitalization.   He saw PCP, Dr. Brigitte Pulse, 6/20, that referred pt here for consideration of monitor placement to assess for afib buren to see if anticoagulation therapy would be needed.   For all that pt has gone thru the last month, he looks well today. He is hoping to have peg tube removed soon. He is also hopeful that his kidneys will recover and he can come off dialysis. He had a SVT ablation with Dr. Lovena Le in 2020. He has a Chad and has been checking rhythm daily with no afib noted.   Today, he denies symptoms of palpitations, chest pain, shortness of breath, orthopnea, PND, lower extremity edema, dizziness, presyncope, syncope, or neurologic sequela. The patient is tolerating medications without difficulties and is otherwise without complaint today.   Past Medical History:  Diagnosis Date   Allergy    Blood transfusion    Diabetes mellitus    DVT (deep venous thrombosis) ([facility])    right   GERD (gastroesophageal reflux disease)    Heart murmur    Hereditary spherocytosis ([place])    Hyperlipidemia    Hypertension    Stroke [facility])    x2 2007   Past Surgical History:  Procedure Laterality Date   COLONOSCOPY     SPLENECTOMY     SVT ABLATION N/A 06/19/2019   Procedure: SVT ABLATION;  Surgeon: Evans Lance, MD;  Location: [place] CV LAB;  Service: Cardiovascular;  Laterality: N/A;   UMBILICAL HERNIA REPAIR     VASECTOMY      Current Outpatient Medications  Medication Sig Dispense Refill   acetaminophen (TYLENOL) 500 MG tablet Take 1,000 mg by mouth every 6 (six) hours as needed for moderate pain or headache.     Cholecalciferol (VITAMIN D3) 50 MCG (2000 UT) TABS Take 2,000 Units by mouth daily.     EPINEPHrine 0.3 mg/0.3 mL IJ SOAJ injection Inject 0.3 mg into the muscle as needed for anaphylaxis (allergy shot).      fiber (NUTRISOURCE FIBER) PACK packet Use 1 packet by g-tube route Two (2) times a day as directed     fluticasone (FLONASE) 50 MCG/ACT nasal spray Place 1 spray into both nostrils daily.     Fluticasone-Sodium Chloride (DERMACINRX TICANASE PAK) 50-2.7 MCG/ACT-% THPK Place into the nose.     Insulin NPH, Human,, Isophane, (NOVOLIN N FLEXPEN) 100 UNIT/ML Kiwkpen Inject into the skin. Inject 5 units under the skin daily and 25 units in the evening     Insulin Pen Needle (PEN NEEDLES 31GX5/16") 31G X 8 MM MISC Use to inject insulin 3 times a day     melatonin 3 MG TABS tablet Takle 3 tablets (9 mg total) by g-tube route nightly as needed.     metoprolol tartrate (LOPRESSOR) 25 MG tablet Does not take in the am on Tuesday, Thursday and Saturday  Olopatadine HCl 0.6 % SOLN Place 2 sprays into the nose 2 (two) times daily.     OVER THE COUNTER MEDICATION Inject 1 Dose as directed once a week. Allergy Shots     pantoprazole sodium (PROTONIX) 40 mg/20 mL PACK Place 20 mLs into feeding tube daily.     Polyethyl Glycol-Propyl Glycol (SYSTANE) 0.4-0.3 % SOLN Place 1 drop into both eyes 3 (three) times daily as needed (dry/irritated eyes.).     UNIFINE PENTIPS 31G X 8 MM MISC      Water For Irrigation, Sterile (STERILE WATER FOR IRRIGATION)      metFORMIN (GLUCOPHAGE) 500 MG tablet Take 1,000 mg by mouth 2 (two) times a day.  (Patient not taking: No sig reported)     OZEMPIC, 1 MG/DOSE, 2 MG/1.5ML SOPN  Inject 1 mg into the skin every Wednesday. (Patient not taking: No sig reported)     rosuvastatin (CRESTOR) 40 MG tablet Take 40 mg by mouth daily. (Patient not taking: No sig reported)     No current facility-administered medications for this encounter.    Allergies  Allergen Reactions   Other Other (See Comments)    Grass,Shrubs,Trees, Dust, Pollen    No Known Allergies     Social History   Socioeconomic History   Marital status: Married    Spouse name: Not on file   Number of children: 2   Years of education: Not on file   Highest education level: Not on file  Occupational History   Occupation: retired  Tobacco Use   Smoking status: Never   Smokeless tobacco: Never  Vaping Use   Vaping Use: Never used  Substance and Sexual Activity   Alcohol use: Yes    Comment: occasionally   Drug use: No   Sexual activity: Not on file  Other Topics Concern   Not on file  Social History Narrative   Not on file   Social Determinants of Health   Financial Resource Strain: Not on file  Food Insecurity: Not on file  Transportation Needs: Not on file  Physical Activity: Not on file  Stress: Not on file  Social Connections: Not on file  Intimate Partner Violence: Not on file    Family History  Problem Relation Age of Onset   Stroke Mother    Lung cancer Father    Hypertension Father    Crohn's disease Daughter    Colon cancer Neg Hx    Esophageal cancer Neg Hx    Rectal cancer Neg Hx    Stomach cancer Neg Hx     ROS- All systems are reviewed and negative except as per the HPI above  Physical Exam: Vitals:   06/07/21 1402  BP: (!) 108/46  Pulse: 61  Weight: 82.4 kg  Height: 5\' 7"  (1.702 m)   Wt Readings from Last 3 Encounters:  06/07/21 82.4 kg  04/11/21 88.9 kg  11/23/20 90.5 kg    Labs: Lab Results  Component Value Date   NA 138 04/11/2021   K 3.8 04/11/2021   CL 102 04/11/2021   CO2 22 04/11/2021   GLUCOSE 322 (H) 04/11/2021   BUN 21 04/11/2021    CREATININE 1.30 (H) 04/11/2021   CALCIUM 9.2 04/11/2021   Lab Results  Component Value Date   INR 1.0 01/05/2020   No results found for: CHOL, HDL, LDLCALC, TRIG   GEN- The patient is well appearing, alert and oriented x 3 today.   Head- normocephalic, atraumatic Eyes-  Sclera clear, conjunctiva  pink Ears- hearing intact Oropharynx- clear Neck- supple, no JVP Lymph- no cervical lymphadenopathy Lungs- Clear to ausculation bilaterally, normal work of breathing Heart- Regular rate and rhythm, no murmurs, rubs or gallops, PMI not laterally displaced GI- soft, NT, ND, + BS Extremities- no clubbing, cyanosis, or edema MS- no significant deformity or atrophy Skin- no rash or lesion Psych- euthymic mood, full affect Neuro- strength and sensation are intact  EKG-Normal sinus rhythm at 61 bpm, pr int 160 ms, qrs int 98 ms, qtc 463 ms  PCP records reviewed   Assessment and Plan:  1. Afib In the setting of  spontaneous esophageal rupture with sepsis, kidney failure in May Initially treated with amiodarone but stopped 2/2 bradycardia  Appears to have been in SR since leaving the hospital  Will place a 2 week ZIO patch to assess for afib burden, per PCP request  No anticoagulation was started at time of rupture when afib presented and pt is currently off his ASA Continue on metoprolol 25 mg bid, he does not take on dialysis days   2. HTN Stable   3.Boerhaave's syndrome  S/p stenting of the esophagus  Recovering   Pt hopes to have peg tube removed soon   4. Renal failure with HD s/p sepsis  Continue 3x a week Pt hopes kidney's will recoer to be able to stop dialysis in the near future   F/u with Dr.Taylor in 4 weeks   Butch Penny C. Nainoa W[place]t, [facility] [address] [place], [place] [address] [phone]
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
## 2021-06-16 IMAGING — DX DG CHEST 1V
1 series · 1 of 1 positions shown · non-contrast
Comparison: Radiograph September 09, 2019.

CLINICAL DATA: Rib fractures.

EXAM:
CHEST  1 VIEW

[chest]
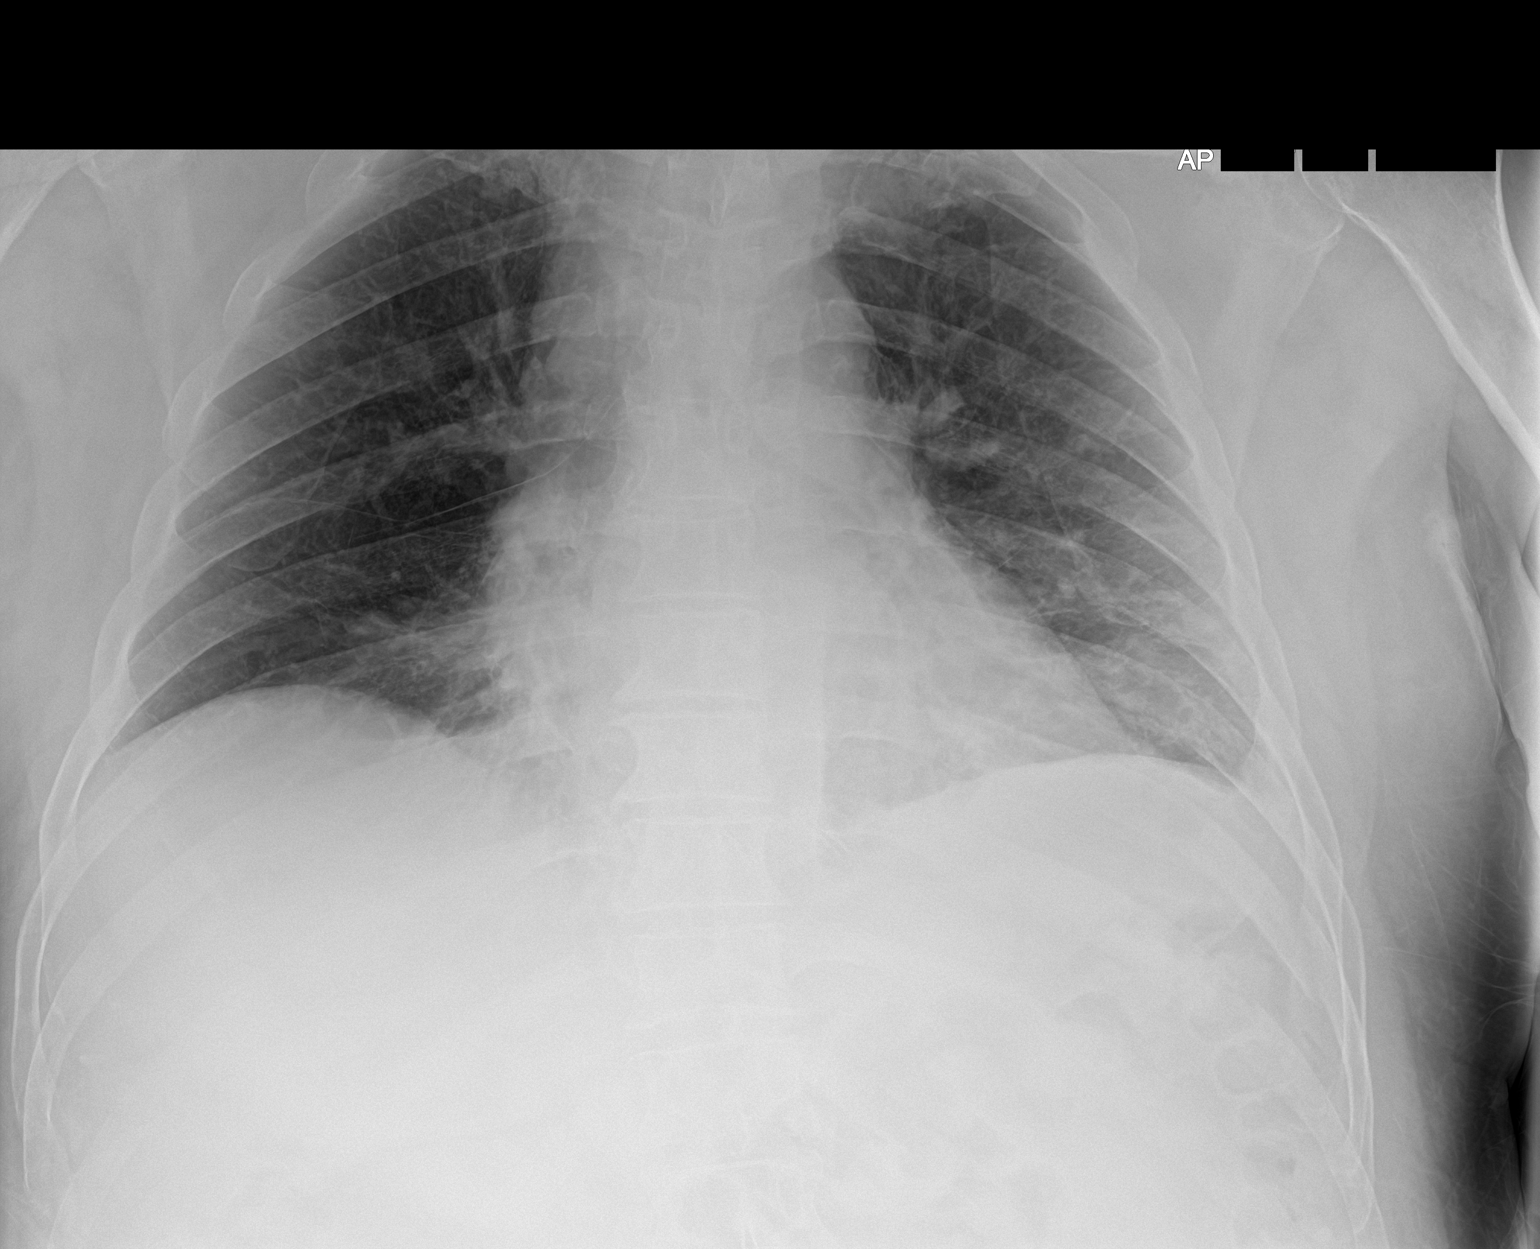

[1 of 1 positions shown; findings below may reference images not displayed]

FINDINGS: Stable cardiomediastinal silhouette. No pneumothorax or pleural
effusion is noted. Right lung is clear. Mild left basilar
subsegmental atelectasis or infiltrate is noted. Mildly displaced
left ninth rib fracture is noted.
IMPRESSION: Mildly displaced left ninth rib fracture is noted. Increased left
basilar opacity is noted suggesting developing atelectasis or
infiltrate.

## 2021-06-27 ENCOUNTER — Encounter (HOSPITAL_COMMUNITY): Payer: Self-pay | Admitting: *Deleted

## 2021-07-21 ENCOUNTER — Other Ambulatory Visit: Payer: Self-pay

## 2021-07-21 ENCOUNTER — Ambulatory Visit: Payer: Federal, State, Local not specified - PPO | Admitting: Internal Medicine

## 2021-07-21 VITALS — BP 118/64 | HR 83 | Ht 67.0 in | Wt 182.0 lb

## 2021-07-21 DIAGNOSIS — I471 Supraventricular tachycardia: Secondary | ICD-10-CM

## 2021-07-21 DIAGNOSIS — I10 Essential (primary) hypertension: Secondary | ICD-10-CM

## 2021-07-21 MED ORDER — ASPIRIN EC 81 MG PO TBEC: 81.0000 mg | DELAYED_RELEASE_TABLET | Freq: Every day | ORAL | 3 refills | Status: AC

## 2021-07-21 NOTE — Patient Instructions (Addendum)
Medication Instructions:  Your physician has recommended you make the following change in your medication:   START taking aspirin 81 mg-  Take one tablet by mouth daily  Labwork: None ordered.  Testing/Procedures: None ordered.  Follow-Up: Your physician wants you to follow-up in: 6 months with Cristopher Peru, MD   Any Other Special Instructions Will Be Listed Below (If Applicable).  If you need a refill on your cardiac medications before your next appointment, please call your pharmacy.

## 2021-07-21 NOTE — Progress Notes (Signed)
HPI Mr. Kurt Lloyd returns today for followup. He is a pleasant 60 yo man with a h/o SVT s/p post ablation of AVNRT. He had a Boerhaves esophagus and underwent repair. He has had occasional palpitations and noted to have non-sustained atrial tachycardia. He has not had syncope. He fell and broke his leg a couple of months ago. He has had some elevation in his evening bps.  Allergies  Allergen Reactions   Other Other (See Comments)    Grass,Shrubs,Trees, Dust, Pollen    No Known Allergies      Current Outpatient Medications  Medication Sig Dispense Refill   acetaminophen (TYLENOL) 500 MG tablet Take 1,000 mg by mouth every 6 (six) hours as needed for moderate pain or headache.     Cholecalciferol (VITAMIN D3) 50 MCG (2000 UT) TABS Take 2,000 Units by mouth daily.     EPINEPHrine 0.3 mg/0.3 mL IJ SOAJ injection Inject 0.3 mg into the muscle as needed for anaphylaxis (allergy shot).      fluticasone (FLONASE) 50 MCG/ACT nasal spray Place 1 spray into both nostrils daily.     Fluticasone-Sodium Chloride (DERMACINRX TICANASE PAK) 50-2.7 MCG/ACT-% THPK Place into the nose.     Insulin Pen Needle (PEN NEEDLES 31GX5/16") 31G X 8 MM MISC Use to inject insulin 3 times a day     metFORMIN (GLUCOPHAGE) 500 MG tablet Take 1,000 mg by mouth 2 (two) times a day.     Olopatadine HCl 0.6 % SOLN Place 2 sprays into the nose 2 (two) times daily.     OVER THE COUNTER MEDICATION Inject 1 Dose as directed once a week. Allergy Shots     pantoprazole sodium (PROTONIX) 40 mg/20 mL PACK Place 20 mLs into feeding tube daily.     Polyethyl Glycol-Propyl Glycol (SYSTANE) 0.4-0.3 % SOLN Place 1 drop into both eyes 3 (three) times daily as needed (dry/irritated eyes.).     rosuvastatin (CRESTOR) 40 MG tablet Take 40 mg by mouth daily.     UNIFINE PENTIPS 31G X 8 MM MISC      Water For Irrigation, Sterile (STERILE WATER FOR IRRIGATION)      OZEMPIC, 1 MG/DOSE, 2 MG/1.5ML SOPN Inject 1 mg into the skin every  Wednesday. (Patient not taking: Reported on 07/21/2021)     No current facility-administered medications for this visit.     Past Medical History:  Diagnosis Date   Allergy    Blood transfusion    Diabetes mellitus    DVT (deep venous thrombosis) (HCC)    right   GERD (gastroesophageal reflux disease)    Heart murmur    Hereditary spherocytosis ([place])    Hyperlipidemia    Hypertension    Stroke ([place])    x2 2007    ROS:   All systems reviewed and negative except as noted in the HPI.   Past Surgical History:  Procedure Laterality Date   COLONOSCOPY     SPLENECTOMY     SVT ABLATION N/A 06/19/2019   Procedure: SVT ABLATION;  Surgeon: Evans Lance, MD;  Location: [facility];  Service: Cardiovascular;  Laterality: N/A;   UMBILICAL HERNIA REPAIR     VASECTOMY       Family History  Problem Relation Age of Onset   Stroke Mother    Lung cancer Father    Hypertension Father    Crohn's disease Daughter    Colon cancer Neg Hx    Esophageal cancer Neg Hx  Rectal cancer Neg Hx    Stomach cancer Neg Hx      Social History   Socioeconomic History   Marital status: Married    Spouse name: Not on Lloyd   Number of children: 2   Years of education: Not on Lloyd   Highest education level: Not on Lloyd  Occupational History   Occupation: retired  Tobacco Use   Smoking status: Never   Smokeless tobacco: Never  Vaping Use   Vaping Use: Never used  Substance and Sexual Activity   Alcohol use: Yes    Comment: occasionally   Drug use: No   Sexual activity: Not on Lloyd  Other Topics Concern   Not on Lloyd  Social History Narrative   Not on Lloyd   Social Determinants of Health   Financial Resource Strain: Not on Lloyd  Food Insecurity: Not on Lloyd  Transportation Needs: Not on Lloyd  Physical Activity: Not on Lloyd  Stress: Not on Lloyd  Social Connections: Not on Lloyd  Intimate Partner Violence: Not on Lloyd     BP 118/64   Pulse 83   Ht 5\' 7"  (1.702 m)    Wt 182 lb (82.6 kg)   SpO2 96%   BMI 28.51 kg/m   Physical Exam:  Well appearing NAD HEENT: Unremarkable Neck:  No JVD, no thyromegally Lymphatics:  No adenopathy Back:  No CVA tenderness Lungs:  Clear with no wheezes HEART:  Regular rate rhythm, no murmurs, no rubs, no clicks Abd:  soft, positive bowel sounds, no organomegally, no rebound, no guarding Ext:  2 plus pulses, no edema, no cyanosis, no clubbing Skin:  No rashes no nodules Neuro:  CN II through XII intact, motor grossly intact  EKG - nsr  Assess/Plan:  SVT - he is s/p ablation of AVNRT about 2 years ago. He will undergo watchful waiting regarding his atrial tachycardia. I encouraged avoidance of caffeine and ETOH. HTN - he has had some elevation of his nocturnal bps. We discussed restarting more beta blocker or another bp med. We will hold for now. PAF - he has a cardio mobile app which shows some very brief heart rhythms which could be atrial fib or not. In light of his propensity to fall, I have asked him to monitor the rhythm and to let us know if he has more. He did have post op atrial fib after his esophageal surgery but his systemic anti-coagulation was stopped.  Dyslipidemia - he will continue low dose crestor.   Carleene Overlie Narcisa Ganesh,MD

## 2021-11-14 DIAGNOSIS — I480 Paroxysmal atrial fibrillation: Secondary | ICD-10-CM | POA: Diagnosis not present

## 2021-11-14 DIAGNOSIS — J3089 Other allergic rhinitis: Secondary | ICD-10-CM | POA: Diagnosis not present

## 2021-11-14 DIAGNOSIS — N1832 Chronic kidney disease, stage 3b: Secondary | ICD-10-CM | POA: Diagnosis not present

## 2021-11-14 DIAGNOSIS — I129 Hypertensive chronic kidney disease with stage 1 through stage 4 chronic kidney disease, or unspecified chronic kidney disease: Secondary | ICD-10-CM | POA: Diagnosis not present

## 2021-11-14 DIAGNOSIS — J3081 Allergic rhinitis due to animal (cat) (dog) hair and dander: Secondary | ICD-10-CM | POA: Diagnosis not present

## 2021-11-14 DIAGNOSIS — J301 Allergic rhinitis due to pollen: Secondary | ICD-10-CM | POA: Diagnosis not present

## 2021-11-17 DIAGNOSIS — J3089 Other allergic rhinitis: Secondary | ICD-10-CM | POA: Diagnosis not present

## 2021-11-17 DIAGNOSIS — J301 Allergic rhinitis due to pollen: Secondary | ICD-10-CM | POA: Diagnosis not present

## 2021-11-17 DIAGNOSIS — J3081 Allergic rhinitis due to animal (cat) (dog) hair and dander: Secondary | ICD-10-CM | POA: Diagnosis not present

## 2021-11-22 DIAGNOSIS — J3089 Other allergic rhinitis: Secondary | ICD-10-CM | POA: Diagnosis not present

## 2021-11-22 DIAGNOSIS — J301 Allergic rhinitis due to pollen: Secondary | ICD-10-CM | POA: Diagnosis not present

## 2021-11-22 DIAGNOSIS — J3081 Allergic rhinitis due to animal (cat) (dog) hair and dander: Secondary | ICD-10-CM | POA: Diagnosis not present

## 2021-11-22 DIAGNOSIS — E1129 Type 2 diabetes mellitus with other diabetic kidney complication: Secondary | ICD-10-CM | POA: Diagnosis not present

## 2021-11-28 DIAGNOSIS — J3089 Other allergic rhinitis: Secondary | ICD-10-CM | POA: Diagnosis not present

## 2021-11-28 DIAGNOSIS — J3081 Allergic rhinitis due to animal (cat) (dog) hair and dander: Secondary | ICD-10-CM | POA: Diagnosis not present

## 2021-11-28 DIAGNOSIS — J301 Allergic rhinitis due to pollen: Secondary | ICD-10-CM | POA: Diagnosis not present

## 2021-12-12 DIAGNOSIS — E1129 Type 2 diabetes mellitus with other diabetic kidney complication: Secondary | ICD-10-CM | POA: Diagnosis not present

## 2021-12-12 DIAGNOSIS — E039 Hypothyroidism, unspecified: Secondary | ICD-10-CM | POA: Diagnosis not present

## 2021-12-14 DIAGNOSIS — J3089 Other allergic rhinitis: Secondary | ICD-10-CM | POA: Diagnosis not present

## 2021-12-14 DIAGNOSIS — J301 Allergic rhinitis due to pollen: Secondary | ICD-10-CM | POA: Diagnosis not present

## 2021-12-14 DIAGNOSIS — J3081 Allergic rhinitis due to animal (cat) (dog) hair and dander: Secondary | ICD-10-CM | POA: Diagnosis not present

## 2021-12-19 DIAGNOSIS — J3081 Allergic rhinitis due to animal (cat) (dog) hair and dander: Secondary | ICD-10-CM | POA: Diagnosis not present

## 2021-12-19 DIAGNOSIS — J301 Allergic rhinitis due to pollen: Secondary | ICD-10-CM | POA: Diagnosis not present

## 2021-12-19 DIAGNOSIS — J3089 Other allergic rhinitis: Secondary | ICD-10-CM | POA: Diagnosis not present

## 2021-12-27 DIAGNOSIS — J3081 Allergic rhinitis due to animal (cat) (dog) hair and dander: Secondary | ICD-10-CM | POA: Diagnosis not present

## 2021-12-27 DIAGNOSIS — J3089 Other allergic rhinitis: Secondary | ICD-10-CM | POA: Diagnosis not present

## 2021-12-27 DIAGNOSIS — J301 Allergic rhinitis due to pollen: Secondary | ICD-10-CM | POA: Diagnosis not present

## 2022-01-08 DIAGNOSIS — J3081 Allergic rhinitis due to animal (cat) (dog) hair and dander: Secondary | ICD-10-CM | POA: Diagnosis not present

## 2022-01-08 DIAGNOSIS — J301 Allergic rhinitis due to pollen: Secondary | ICD-10-CM | POA: Diagnosis not present

## 2022-01-09 DIAGNOSIS — J3081 Allergic rhinitis due to animal (cat) (dog) hair and dander: Secondary | ICD-10-CM | POA: Diagnosis not present

## 2022-01-09 DIAGNOSIS — J3089 Other allergic rhinitis: Secondary | ICD-10-CM | POA: Diagnosis not present

## 2022-01-09 DIAGNOSIS — J301 Allergic rhinitis due to pollen: Secondary | ICD-10-CM | POA: Diagnosis not present

## 2022-01-30 ENCOUNTER — Ambulatory Visit: Payer: Federal, State, Local not specified - PPO | Admitting: Internal Medicine

## 2022-02-12 ENCOUNTER — Ambulatory Visit: Payer: Federal, State, Local not specified - PPO | Admitting: Internal Medicine

## 2022-02-12 ENCOUNTER — Other Ambulatory Visit: Payer: Self-pay

## 2022-02-12 ENCOUNTER — Encounter: Payer: Self-pay | Admitting: Internal Medicine

## 2022-02-12 VITALS — BP 142/64 | HR 65 | Ht 67.0 in | Wt 210.0 lb

## 2022-02-12 DIAGNOSIS — R002 Palpitations: Secondary | ICD-10-CM

## 2022-02-12 DIAGNOSIS — I471 Supraventricular tachycardia: Secondary | ICD-10-CM

## 2022-02-12 DIAGNOSIS — J3081 Allergic rhinitis due to animal (cat) (dog) hair and dander: Secondary | ICD-10-CM | POA: Diagnosis not present

## 2022-02-12 DIAGNOSIS — J3089 Other allergic rhinitis: Secondary | ICD-10-CM | POA: Diagnosis not present

## 2022-02-12 DIAGNOSIS — I10 Essential (primary) hypertension: Secondary | ICD-10-CM | POA: Diagnosis not present

## 2022-02-12 DIAGNOSIS — E782 Mixed hyperlipidemia: Secondary | ICD-10-CM

## 2022-02-12 DIAGNOSIS — J301 Allergic rhinitis due to pollen: Secondary | ICD-10-CM | POA: Diagnosis not present

## 2022-02-12 NOTE — Patient Instructions (Addendum)
Medication Instructions:  ?Your physician recommends that you continue on your current medications as directed. Please refer to the Current Medication list given to you today. ? ?Labwork: ?None ordered. ? ?Testing/Procedures: ?None ordered. ? ?Follow-Up: ?Your physician wants you to follow-up as needed ? ? ?Any Other Special Instructions Will Be Listed Below (If Applicable). ? ?If you need a refill on your cardiac medications before your next appointment, please call your pharmacy.  ? ? ? ? ?

## 2022-02-12 NOTE — Progress Notes (Signed)
? ? ? ? ?HPI ?Mr. Kurt Lloyd returns today for followup. He is a pleasant 61 yo man with a h/o SVT s/p post ablation of AVNRT. He had a Boerhaves esophagus and underwent repair. He has had occasional palpitations and noted to have non-sustained atrial tachycardia. He has not had syncope. He feels well. He just got back from a month in [place]. He denies chest pain or sob.  ?Allergies  ?Allergen Reactions  ? Other Other (See Comments)  ?  Grass,Shrubs,Trees, Dust, Pollen   ? No Known Allergies   ? ? ? ?Current Outpatient Medications  ?Medication Sig Dispense Refill  ? acetaminophen (TYLENOL) 500 MG tablet Take 1,000 mg by mouth every 6 (six) hours as needed for moderate pain or headache.    ? aspirin EC 81 MG tablet Take 1 tablet (81 mg total) by mouth daily. Swallow whole. 90 tablet 3  ? Cholecalciferol (VITAMIN D3) 50 MCG (2000 UT) TABS Take 2,000 Units by mouth daily.    ? EPINEPHrine 0.3 mg/0.3 mL IJ SOAJ injection Inject 0.3 mg into the muscle as needed for anaphylaxis (allergy shot).     ? fluticasone (FLONASE) 50 MCG/ACT nasal spray Place 1 spray into both nostrils daily.    ? Fluticasone-Sodium Chloride (DERMACINRX TICANASE PAK) 50-2.7 MCG/ACT-% THPK Place into the nose.    ? Insulin Pen Needle (PEN NEEDLES 31GX5/16") 31G X 8 MM MISC Use to inject insulin 3 times a day    ? metFORMIN (GLUCOPHAGE) 500 MG tablet Take 1,000 mg by mouth 2 (two) times a day.    ? Olopatadine HCl 0.6 % SOLN Place 2 sprays into the nose 2 (two) times daily.    ? OVER THE COUNTER MEDICATION Inject 1 Dose as directed once a week. Allergy Shots    ? OZEMPIC, 1 MG/DOSE, 2 MG/1.5ML SOPN Inject 1 mg into the skin every Wednesday.    ? pantoprazole sodium (PROTONIX) 40 mg/20 mL PACK Place 20 mLs into feeding tube daily.    ? Polyethyl Glycol-Propyl Glycol (SYSTANE) 0.4-0.3 % SOLN Place 1 drop into both eyes 3 (three) times daily as needed (dry/irritated eyes.).    ? rosuvastatin (CRESTOR) 40 MG tablet Take 40 mg by mouth daily.    ? UNIFINE  PENTIPS 31G X 8 MM MISC     ? Water For Irrigation, Sterile (STERILE WATER FOR IRRIGATION)     ? ?No current facility-administered medications for this visit.  ? ? ? ?Past Medical History:  ?Diagnosis Date  ? Allergy   ? Blood transfusion   ? Diabetes mellitus   ? DVT (deep venous thrombosis) ([place])   ? right  ? GERD (gastroesophageal reflux disease)   ? Heart murmur   ? Hereditary spherocytosis ([place])   ? Hyperlipidemia   ? Hypertension   ? Stroke [facility])   ? x2 2007  ? ? ?ROS: ? ? All systems reviewed and negative except as noted in the HPI. ? ? ?Past Surgical History:  ?Procedure Laterality Date  ? COLONOSCOPY    ? SPLENECTOMY    ? SVT ABLATION N/A 06/19/2019  ? Procedure: SVT ABLATION;  Surgeon: Evans Lance, MD;  Location: [facility];  Service: Cardiovascular;  Laterality: N/A;  ? UMBILICAL HERNIA REPAIR    ? VASECTOMY    ? ? ? ?Family History  ?Problem Relation Age of Onset  ? Stroke Mother   ? Lung cancer Father   ? Hypertension Father   ? Crohn's disease Daughter   ?  Colon cancer Neg Hx   ? Esophageal cancer Neg Hx   ? Rectal cancer Neg Hx   ? Stomach cancer Neg Hx   ? ? ? ?Social History  ? ?Socioeconomic History  ? Marital status: Married  ?  Spouse name: Not on Lloyd  ? Number of children: 2  ? Years of education: Not on Lloyd  ? Highest education level: Not on Lloyd  ?Occupational History  ? Occupation: retired  ?Tobacco Use  ? Smoking status: Never  ? Smokeless tobacco: Never  ?Vaping Use  ? Vaping Use: Never used  ?Substance and Sexual Activity  ? Alcohol use: Yes  ?  Comment: occasionally  ? Drug use: No  ? Sexual activity: Not on Lloyd  ?Other Topics Concern  ? Not on Lloyd  ?Social History Narrative  ? Not on Lloyd  ? ?Social Determinants of Health  ? ?Financial Resource Strain: Not on Lloyd  ?Food Insecurity: Not on Lloyd  ?Transportation Needs: Not on Lloyd  ?Physical Activity: Not on Lloyd  ?Stress: Not on Lloyd  ?Social Connections: Not on Lloyd  ?Intimate Partner Violence: Not on Lloyd  ? ? ? ?BP (!)  142/64   Pulse 65   Ht '5\' 7"'$  (1.702 m)   Wt 210 lb (95.3 kg)   SpO2 97%   BMI 32.89 kg/m?  ? ?Physical Exam: ? ?Well appearing NAD ?HEENT: Unremarkable ?Neck:  No JVD, no thyromegally ?Lymphatics:  No adenopathy ?Back:  No CVA tenderness ?Lungs:  Clear with no wheezes ?HEART:  Regular rate rhythm, no murmurs, no rubs, no clicks ?Abd:  soft, positive bowel sounds, no organomegally, no rebound, no guarding ?Ext:  2 plus pulses, no edema, no cyanosis, no clubbing ?Skin:  No rashes no nodules ?Neuro:  CN II through XII intact, motor grossly intact ? ?EKG - nsr ? ? ? ?Assess/Plan:  ? ?SVT - he is s/p ablation of AVNRT about 2 years ago. He will undergo watchful waiting regarding his atrial tachycardia. I encouraged avoidance of caffeine and ETOH. He has a Electrical engineer.  ?HTN - his bp is up a bit. I have encouraged weight loss. ?PAF - he has a cardio mobile app but did not bring in any strips today. He will undergo watchful waiting. No clear cut prolonged atrial fib. ?Dyslipidemia - he will continue low dose crestor.  ?  ?Carleene Overlie Karmel Patricelli,MD ?

## 2022-02-21 DIAGNOSIS — E1129 Type 2 diabetes mellitus with other diabetic kidney complication: Secondary | ICD-10-CM | POA: Diagnosis not present

## 2022-02-21 DIAGNOSIS — J3089 Other allergic rhinitis: Secondary | ICD-10-CM | POA: Diagnosis not present

## 2022-02-21 DIAGNOSIS — J301 Allergic rhinitis due to pollen: Secondary | ICD-10-CM | POA: Diagnosis not present

## 2022-02-21 DIAGNOSIS — J3081 Allergic rhinitis due to animal (cat) (dog) hair and dander: Secondary | ICD-10-CM | POA: Diagnosis not present

## 2022-02-26 DIAGNOSIS — J3081 Allergic rhinitis due to animal (cat) (dog) hair and dander: Secondary | ICD-10-CM | POA: Diagnosis not present

## 2022-02-26 DIAGNOSIS — J301 Allergic rhinitis due to pollen: Secondary | ICD-10-CM | POA: Diagnosis not present

## 2022-02-26 DIAGNOSIS — J3089 Other allergic rhinitis: Secondary | ICD-10-CM | POA: Diagnosis not present

## 2022-03-06 DIAGNOSIS — J3089 Other allergic rhinitis: Secondary | ICD-10-CM | POA: Diagnosis not present

## 2022-03-06 DIAGNOSIS — J301 Allergic rhinitis due to pollen: Secondary | ICD-10-CM | POA: Diagnosis not present

## 2022-03-06 DIAGNOSIS — J3081 Allergic rhinitis due to animal (cat) (dog) hair and dander: Secondary | ICD-10-CM | POA: Diagnosis not present

## 2022-03-12 DIAGNOSIS — J301 Allergic rhinitis due to pollen: Secondary | ICD-10-CM | POA: Diagnosis not present

## 2022-03-12 DIAGNOSIS — J3089 Other allergic rhinitis: Secondary | ICD-10-CM | POA: Diagnosis not present

## 2022-03-12 DIAGNOSIS — J3081 Allergic rhinitis due to animal (cat) (dog) hair and dander: Secondary | ICD-10-CM | POA: Diagnosis not present

## 2022-03-26 DIAGNOSIS — J3081 Allergic rhinitis due to animal (cat) (dog) hair and dander: Secondary | ICD-10-CM | POA: Diagnosis not present

## 2022-03-26 DIAGNOSIS — J301 Allergic rhinitis due to pollen: Secondary | ICD-10-CM | POA: Diagnosis not present

## 2022-03-26 DIAGNOSIS — J3089 Other allergic rhinitis: Secondary | ICD-10-CM | POA: Diagnosis not present

## 2022-03-27 DIAGNOSIS — E11319 Type 2 diabetes mellitus with unspecified diabetic retinopathy without macular edema: Secondary | ICD-10-CM | POA: Diagnosis not present

## 2022-03-29 DIAGNOSIS — E785 Hyperlipidemia, unspecified: Secondary | ICD-10-CM | POA: Diagnosis not present

## 2022-03-29 DIAGNOSIS — N179 Acute kidney failure, unspecified: Secondary | ICD-10-CM | POA: Diagnosis not present

## 2022-03-29 DIAGNOSIS — E1122 Type 2 diabetes mellitus with diabetic chronic kidney disease: Secondary | ICD-10-CM | POA: Diagnosis not present

## 2022-03-29 DIAGNOSIS — E8801 Alpha-1-antitrypsin deficiency: Secondary | ICD-10-CM | POA: Diagnosis not present

## 2022-04-02 DIAGNOSIS — J3089 Other allergic rhinitis: Secondary | ICD-10-CM | POA: Diagnosis not present

## 2022-04-02 DIAGNOSIS — J3081 Allergic rhinitis due to animal (cat) (dog) hair and dander: Secondary | ICD-10-CM | POA: Diagnosis not present

## 2022-04-02 DIAGNOSIS — J301 Allergic rhinitis due to pollen: Secondary | ICD-10-CM | POA: Diagnosis not present

## 2022-04-06 ENCOUNTER — Other Ambulatory Visit: Payer: Self-pay | Admitting: Nephrology

## 2022-04-06 DIAGNOSIS — E8801 Alpha-1-antitrypsin deficiency: Secondary | ICD-10-CM

## 2022-04-09 DIAGNOSIS — J3089 Other allergic rhinitis: Secondary | ICD-10-CM | POA: Diagnosis not present

## 2022-04-09 DIAGNOSIS — J3081 Allergic rhinitis due to animal (cat) (dog) hair and dander: Secondary | ICD-10-CM | POA: Diagnosis not present

## 2022-04-09 DIAGNOSIS — J301 Allergic rhinitis due to pollen: Secondary | ICD-10-CM | POA: Diagnosis not present

## 2022-04-10 DIAGNOSIS — E1129 Type 2 diabetes mellitus with other diabetic kidney complication: Secondary | ICD-10-CM | POA: Diagnosis not present

## 2022-04-10 DIAGNOSIS — I700 Atherosclerosis of aorta: Secondary | ICD-10-CM | POA: Diagnosis not present

## 2022-04-11 ENCOUNTER — Ambulatory Visit
Admission: RE | Admit: 2022-04-11 | Discharge: 2022-04-11 | Disposition: A | Discharge status: Home / Self Care | Payer: Federal, State, Local not specified - PPO | Source: Ambulatory Visit | Attending: Nephrology | Admitting: Nephrology

## 2022-04-11 DIAGNOSIS — R911 Solitary pulmonary nodule: Secondary | ICD-10-CM | POA: Diagnosis not present

## 2022-04-11 DIAGNOSIS — E8801 Alpha-1-antitrypsin deficiency: Secondary | ICD-10-CM

## 2022-04-11 DIAGNOSIS — J439 Emphysema, unspecified: Secondary | ICD-10-CM | POA: Diagnosis not present

## 2022-04-25 DIAGNOSIS — J301 Allergic rhinitis due to pollen: Secondary | ICD-10-CM | POA: Diagnosis not present

## 2022-04-25 DIAGNOSIS — J3089 Other allergic rhinitis: Secondary | ICD-10-CM | POA: Diagnosis not present

## 2022-04-25 DIAGNOSIS — J3081 Allergic rhinitis due to animal (cat) (dog) hair and dander: Secondary | ICD-10-CM | POA: Diagnosis not present

## 2022-05-01 ENCOUNTER — Encounter: Payer: Self-pay | Admitting: Internal Medicine

## 2022-05-04 ENCOUNTER — Ambulatory Visit: Payer: Federal, State, Local not specified - PPO | Admitting: Internal Medicine

## 2022-05-04 DIAGNOSIS — J3089 Other allergic rhinitis: Secondary | ICD-10-CM | POA: Diagnosis not present

## 2022-05-04 DIAGNOSIS — J3081 Allergic rhinitis due to animal (cat) (dog) hair and dander: Secondary | ICD-10-CM | POA: Diagnosis not present

## 2022-05-04 DIAGNOSIS — J301 Allergic rhinitis due to pollen: Secondary | ICD-10-CM | POA: Diagnosis not present

## 2022-05-11 DIAGNOSIS — J301 Allergic rhinitis due to pollen: Secondary | ICD-10-CM | POA: Diagnosis not present

## 2022-05-11 DIAGNOSIS — J3081 Allergic rhinitis due to animal (cat) (dog) hair and dander: Secondary | ICD-10-CM | POA: Diagnosis not present

## 2022-05-11 DIAGNOSIS — J3089 Other allergic rhinitis: Secondary | ICD-10-CM | POA: Diagnosis not present

## 2022-06-13 DIAGNOSIS — J301 Allergic rhinitis due to pollen: Secondary | ICD-10-CM | POA: Diagnosis not present

## 2022-06-13 DIAGNOSIS — E1129 Type 2 diabetes mellitus with other diabetic kidney complication: Secondary | ICD-10-CM | POA: Diagnosis not present

## 2022-06-13 DIAGNOSIS — I129 Hypertensive chronic kidney disease with stage 1 through stage 4 chronic kidney disease, or unspecified chronic kidney disease: Secondary | ICD-10-CM | POA: Diagnosis not present

## 2022-06-13 DIAGNOSIS — J3089 Other allergic rhinitis: Secondary | ICD-10-CM | POA: Diagnosis not present

## 2022-06-14 ENCOUNTER — Encounter: Payer: Self-pay | Admitting: Internal Medicine

## 2022-06-14 ENCOUNTER — Ambulatory Visit: Payer: Federal, State, Local not specified - PPO | Admitting: Internal Medicine

## 2022-06-14 VITALS — BP 122/68 | HR 55 | Ht 67.0 in | Wt 212.2 lb

## 2022-06-14 DIAGNOSIS — Z8349 Family history of other endocrine, nutritional and metabolic diseases: Secondary | ICD-10-CM

## 2022-06-14 DIAGNOSIS — J849 Interstitial pulmonary disease, unspecified: Secondary | ICD-10-CM | POA: Diagnosis not present

## 2022-06-14 DIAGNOSIS — K7460 Unspecified cirrhosis of liver: Secondary | ICD-10-CM

## 2022-06-14 NOTE — Patient Instructions (Addendum)
ICD-10-CM   1. ILD (interstitial lung disease) ([place])  J84.9     2. Family history of alpha-1-antitrypsin deficiency  Z83.49     3. Cirrhosis of liver without ascites, unspecified hepatic cirrhosis type (HCC)  K74.60       ILD  - is very mild and stable over time - seems more consistent with post inflammatory scarring  Family hx of alpha-1 with CT evidnce of cirrhosis  - need to rule out alpha 1 in you  Plan  - get blood work alpha 1 Phenotype 06/14/2022 = do spirometry and dlco in 8 months  Followup  - 8 months but after PFT test - 15 min slt
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
## 2022-06-18 DIAGNOSIS — J3081 Allergic rhinitis due to animal (cat) (dog) hair and dander: Secondary | ICD-10-CM | POA: Diagnosis not present

## 2022-06-18 DIAGNOSIS — J3089 Other allergic rhinitis: Secondary | ICD-10-CM | POA: Diagnosis not present

## 2022-06-18 DIAGNOSIS — J301 Allergic rhinitis due to pollen: Secondary | ICD-10-CM | POA: Diagnosis not present

## 2022-06-20 LAB — ALPHA-1 ANTITRYPSIN PHENOTYPE: A-1 Antitrypsin, Ser: 153 mg/dL (ref 83–199)

## 2022-06-21 DIAGNOSIS — J3081 Allergic rhinitis due to animal (cat) (dog) hair and dander: Secondary | ICD-10-CM | POA: Diagnosis not present

## 2022-06-21 DIAGNOSIS — J301 Allergic rhinitis due to pollen: Secondary | ICD-10-CM | POA: Diagnosis not present

## 2022-06-21 DIAGNOSIS — J3089 Other allergic rhinitis: Secondary | ICD-10-CM | POA: Diagnosis not present

## 2022-06-27 DIAGNOSIS — J301 Allergic rhinitis due to pollen: Secondary | ICD-10-CM | POA: Diagnosis not present

## 2022-06-27 DIAGNOSIS — J3081 Allergic rhinitis due to animal (cat) (dog) hair and dander: Secondary | ICD-10-CM | POA: Diagnosis not present

## 2022-06-27 DIAGNOSIS — J3089 Other allergic rhinitis: Secondary | ICD-10-CM | POA: Diagnosis not present

## 2022-07-04 DIAGNOSIS — J3089 Other allergic rhinitis: Secondary | ICD-10-CM | POA: Diagnosis not present

## 2022-07-04 DIAGNOSIS — J3081 Allergic rhinitis due to animal (cat) (dog) hair and dander: Secondary | ICD-10-CM | POA: Diagnosis not present

## 2022-07-04 DIAGNOSIS — J301 Allergic rhinitis due to pollen: Secondary | ICD-10-CM | POA: Diagnosis not present

## 2022-07-09 DIAGNOSIS — J301 Allergic rhinitis due to pollen: Secondary | ICD-10-CM | POA: Diagnosis not present

## 2022-07-09 DIAGNOSIS — J3089 Other allergic rhinitis: Secondary | ICD-10-CM | POA: Diagnosis not present

## 2022-07-18 DIAGNOSIS — J3089 Other allergic rhinitis: Secondary | ICD-10-CM | POA: Diagnosis not present

## 2022-07-18 DIAGNOSIS — J301 Allergic rhinitis due to pollen: Secondary | ICD-10-CM | POA: Diagnosis not present

## 2022-07-24 DIAGNOSIS — J3089 Other allergic rhinitis: Secondary | ICD-10-CM | POA: Diagnosis not present

## 2022-07-24 DIAGNOSIS — J3081 Allergic rhinitis due to animal (cat) (dog) hair and dander: Secondary | ICD-10-CM | POA: Diagnosis not present

## 2022-07-24 DIAGNOSIS — J301 Allergic rhinitis due to pollen: Secondary | ICD-10-CM | POA: Diagnosis not present

## 2022-07-31 DIAGNOSIS — I129 Hypertensive chronic kidney disease with stage 1 through stage 4 chronic kidney disease, or unspecified chronic kidney disease: Secondary | ICD-10-CM | POA: Diagnosis not present

## 2022-07-31 DIAGNOSIS — L237 Allergic contact dermatitis due to plants, except food: Secondary | ICD-10-CM | POA: Diagnosis not present

## 2022-08-02 DIAGNOSIS — J3089 Other allergic rhinitis: Secondary | ICD-10-CM | POA: Diagnosis not present

## 2022-08-02 DIAGNOSIS — I480 Paroxysmal atrial fibrillation: Secondary | ICD-10-CM | POA: Diagnosis not present

## 2022-08-02 DIAGNOSIS — J301 Allergic rhinitis due to pollen: Secondary | ICD-10-CM | POA: Diagnosis not present

## 2022-08-02 DIAGNOSIS — E1122 Type 2 diabetes mellitus with diabetic chronic kidney disease: Secondary | ICD-10-CM | POA: Diagnosis not present

## 2022-08-02 DIAGNOSIS — J3081 Allergic rhinitis due to animal (cat) (dog) hair and dander: Secondary | ICD-10-CM | POA: Diagnosis not present

## 2022-08-02 DIAGNOSIS — N1831 Chronic kidney disease, stage 3a: Secondary | ICD-10-CM | POA: Diagnosis not present

## 2022-08-06 DIAGNOSIS — J3089 Other allergic rhinitis: Secondary | ICD-10-CM | POA: Diagnosis not present

## 2022-08-06 DIAGNOSIS — J3081 Allergic rhinitis due to animal (cat) (dog) hair and dander: Secondary | ICD-10-CM | POA: Diagnosis not present

## 2022-08-06 DIAGNOSIS — J301 Allergic rhinitis due to pollen: Secondary | ICD-10-CM | POA: Diagnosis not present

## 2022-08-15 DIAGNOSIS — J3081 Allergic rhinitis due to animal (cat) (dog) hair and dander: Secondary | ICD-10-CM | POA: Diagnosis not present

## 2022-08-15 DIAGNOSIS — J3089 Other allergic rhinitis: Secondary | ICD-10-CM | POA: Diagnosis not present

## 2022-08-15 DIAGNOSIS — J301 Allergic rhinitis due to pollen: Secondary | ICD-10-CM | POA: Diagnosis not present

## 2022-08-24 DIAGNOSIS — J301 Allergic rhinitis due to pollen: Secondary | ICD-10-CM | POA: Diagnosis not present

## 2022-08-24 DIAGNOSIS — J3089 Other allergic rhinitis: Secondary | ICD-10-CM | POA: Diagnosis not present

## 2022-08-24 DIAGNOSIS — J3081 Allergic rhinitis due to animal (cat) (dog) hair and dander: Secondary | ICD-10-CM | POA: Diagnosis not present

## 2022-08-30 DIAGNOSIS — J3081 Allergic rhinitis due to animal (cat) (dog) hair and dander: Secondary | ICD-10-CM | POA: Diagnosis not present

## 2022-08-30 DIAGNOSIS — J301 Allergic rhinitis due to pollen: Secondary | ICD-10-CM | POA: Diagnosis not present

## 2022-08-31 DIAGNOSIS — E785 Hyperlipidemia, unspecified: Secondary | ICD-10-CM | POA: Diagnosis not present

## 2022-08-31 DIAGNOSIS — I10 Essential (primary) hypertension: Secondary | ICD-10-CM | POA: Diagnosis not present

## 2022-08-31 DIAGNOSIS — Z125 Encounter for screening for malignant neoplasm of prostate: Secondary | ICD-10-CM | POA: Diagnosis not present

## 2022-08-31 DIAGNOSIS — E1129 Type 2 diabetes mellitus with other diabetic kidney complication: Secondary | ICD-10-CM | POA: Diagnosis not present

## 2022-08-31 DIAGNOSIS — J3089 Other allergic rhinitis: Secondary | ICD-10-CM | POA: Diagnosis not present

## 2022-09-03 DIAGNOSIS — J301 Allergic rhinitis due to pollen: Secondary | ICD-10-CM | POA: Diagnosis not present

## 2022-09-03 DIAGNOSIS — J3081 Allergic rhinitis due to animal (cat) (dog) hair and dander: Secondary | ICD-10-CM | POA: Diagnosis not present

## 2022-09-03 DIAGNOSIS — J3089 Other allergic rhinitis: Secondary | ICD-10-CM | POA: Diagnosis not present

## 2022-09-07 DIAGNOSIS — Z1331 Encounter for screening for depression: Secondary | ICD-10-CM | POA: Diagnosis not present

## 2022-09-07 DIAGNOSIS — Z0000 Encounter for general adult medical examination without abnormal findings: Secondary | ICD-10-CM | POA: Diagnosis not present

## 2022-09-07 DIAGNOSIS — R82998 Other abnormal findings in urine: Secondary | ICD-10-CM | POA: Diagnosis not present

## 2022-09-07 DIAGNOSIS — E1129 Type 2 diabetes mellitus with other diabetic kidney complication: Secondary | ICD-10-CM | POA: Diagnosis not present

## 2022-09-07 DIAGNOSIS — K7460 Unspecified cirrhosis of liver: Secondary | ICD-10-CM | POA: Diagnosis not present

## 2022-09-07 DIAGNOSIS — Z1339 Encounter for screening examination for other mental health and behavioral disorders: Secondary | ICD-10-CM | POA: Diagnosis not present

## 2022-09-07 DIAGNOSIS — I129 Hypertensive chronic kidney disease with stage 1 through stage 4 chronic kidney disease, or unspecified chronic kidney disease: Secondary | ICD-10-CM | POA: Diagnosis not present

## 2022-09-07 DIAGNOSIS — Z23 Encounter for immunization: Secondary | ICD-10-CM | POA: Diagnosis not present

## 2022-09-13 DIAGNOSIS — J301 Allergic rhinitis due to pollen: Secondary | ICD-10-CM | POA: Diagnosis not present

## 2022-09-13 DIAGNOSIS — J3089 Other allergic rhinitis: Secondary | ICD-10-CM | POA: Diagnosis not present

## 2022-09-13 DIAGNOSIS — J3081 Allergic rhinitis due to animal (cat) (dog) hair and dander: Secondary | ICD-10-CM | POA: Diagnosis not present

## 2022-09-18 ENCOUNTER — Other Ambulatory Visit: Payer: Self-pay

## 2022-09-18 DIAGNOSIS — I5032 Chronic diastolic (congestive) heart failure: Secondary | ICD-10-CM

## 2022-09-20 DIAGNOSIS — J3081 Allergic rhinitis due to animal (cat) (dog) hair and dander: Secondary | ICD-10-CM | POA: Diagnosis not present

## 2022-09-20 DIAGNOSIS — J301 Allergic rhinitis due to pollen: Secondary | ICD-10-CM | POA: Diagnosis not present

## 2022-09-20 DIAGNOSIS — J3089 Other allergic rhinitis: Secondary | ICD-10-CM | POA: Diagnosis not present

## 2022-10-01 ENCOUNTER — Encounter: Payer: Self-pay | Admitting: Hematology

## 2022-10-01 ENCOUNTER — Ambulatory Visit: Payer: Federal, State, Local not specified - PPO

## 2022-10-01 ENCOUNTER — Other Ambulatory Visit: Payer: Federal, State, Local not specified - PPO

## 2022-10-01 DIAGNOSIS — I5032 Chronic diastolic (congestive) heart failure: Secondary | ICD-10-CM | POA: Diagnosis not present

## 2022-10-02 NOTE — Progress Notes (Signed)
"  ZEUS Trial" (Ziltevekimab [a monoclonal antibody] 15 mg SQ monthly to reduce MACE in ASCVD, CKD and chronic inflammation.  FYI. Echo PDF is attached.   Patient being screened into the trial. Echo done as protocol.  Echocardiogram 10/01/2022:  Normal LV systolic function with visual EF 55-60%. Left ventricle cavity is normal in size. Normal global wall motion. Indeterminate diastolic filling pattern. Calculated EF 58%. Left atrial cavity is mildly dilated. Structurally normal tricuspid valve.  Mild tricuspid regurgitation. No evidence of pulmonary hypertension. Structurally normal pulmonic valve.  Mild pulmonic regurgitation. No prior available for comparison

## 2022-10-22 ENCOUNTER — Ambulatory Visit: Payer: Federal, State, Local not specified - PPO | Admitting: Nurse Practitioner

## 2022-10-22 ENCOUNTER — Encounter: Payer: Self-pay | Admitting: Nurse Practitioner

## 2022-10-22 DIAGNOSIS — Z1211 Encounter for screening for malignant neoplasm of colon: Secondary | ICD-10-CM | POA: Diagnosis not present

## 2022-10-22 DIAGNOSIS — J3089 Other allergic rhinitis: Secondary | ICD-10-CM | POA: Diagnosis not present

## 2022-10-22 DIAGNOSIS — J301 Allergic rhinitis due to pollen: Secondary | ICD-10-CM | POA: Diagnosis not present

## 2022-10-22 DIAGNOSIS — K219 Gastro-esophageal reflux disease without esophagitis: Secondary | ICD-10-CM

## 2022-10-22 DIAGNOSIS — J3081 Allergic rhinitis due to animal (cat) (dog) hair and dander: Secondary | ICD-10-CM | POA: Diagnosis not present

## 2022-10-22 NOTE — Progress Notes (Unsigned)
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 Past ventral hernia repair with mesh placement.   He was admitted to [facility] 04/11/2021 with an esophageal perforation to the distal esophagus s/p EGD and stent placement across the perforation. Reported history of chronic dysphagia. His hospital course was complicated by persistent nausea, feeding intolerance with subsequent stent removal, PEG placement, right pleural effsion s/p pigtail chest tube placement, atrial fibrillation with acute renal failure which required CRRT then hemodialysis. A swallow study 05/01/2021 showed contrast extravasation to the right of the GE junction, therefore he was kept NPO and antibiotics were continued. A subscapular left liver abscess noted on interval imaging was drained by IR 05/02/2021 (cultures no growth, 2+ PMNs). A tunneled dialysis catheter was placed on 05/04/2021 and his PEG tube was converted to a G-J for distal feeding. His clinical status stabilized and he was transferred to acute inpatient rehab 05/17/2021. He underwent a follow up EGD at [facility] 07/03/2021 and the transgastric stents were found in the gastric body and were removed and the [place] tube was removed.   He presents to our office today as referred by Dr. Marton Redwood to discuss scheduling an EGD and colonoscopy to for further evaluation regarding possible cirrhosis. He tolerates a regular diet without any difficulty. He denies having any dysphagia or heartburn.  No upper abdominal pain. He remains  on Pantoprazole 40 mg once daily.  He is passing a normal formed brown bowel movement once daily.  No rectal bleeding or black stools.  He underwent a colonoscopy 03/2012 by Dr. Carlean Purl which was normal. No weight loss.  No fever, sweats or chills.  He underwent a chest CT 04/11/2022 which identified a right lung lesion, subpleural scarring suggestive of ILD or alpha 1 antitrypsin deficiency and nodularity of the liver concerning for cirrhosis. CT 04/2021 also noted possible cirrhosis.  He denies any prior history of liver disease.  He has a history of hemochromatosis which has required phlebotomy. His last phlebotomy session was around 03/2021. Father and brother with history of hemochromatosis without associated liver disease. No history of alcohol use disorder.  He drinks 2-3 mixed drinks yearly.  He reported having labs done recently by Dr. Brigitte Pulse and he reported his ferritin level was ok. I will request a copy of his labs for further review.  ADDENDUM: Labs received from Dr. Raul Del office after today's office visit was completed.  Labs 08/31/2022: WBC 8.54. Hg 13. HCT 30.5. PLT 316. Ferritin level 185. Goal Ferritin level < 50. Dr Brigitte Pulse recommended phlebotomy every 8 to 12 weeks at that point which has not been done. Patient reported his last phlebotomy session was 03/2021.   Regarding the abnormal pulmonary findings on chest CT, he was evaluated by pulmonologist Dr. Chase Caller 06/14/2022. Alpha 1 antitrypsin level 153 was normal. The pulmonary lesions was thought to be impacted mucous and a lung biopsy was not indicated. He denies having any cough, SOB or hemoptysis.  Labs 08/31/2022: T. Bili 1.1. Alk phos 85. AST 26. ALT 26. Albumin 3.6. BUN 26. Cr 1.9. WBC 8.54. Hg 13. HCT 30.5. PLT 316. Ferritin level 185.  Goal Ferritin level < 50 recommended. Dr Brigitte Pulse recommended phlebotomy every 8 to 12 weeks  PAST GI PROCEDURES:   EGD at [facility] 04/11/2021: ESOPHAGOSCOPY,FLEX, TRANSORAL W PLACEMENT OF ENDOSCOPIC  STENTS  EGD 07/03/2021: Two previously placed transgastric stents were found in the gastric body.  Stent removal of both was accomplished with a Raptor grasping device. The indwelling GJ tube was removed after deflating the balloon.         Colonoscopy 04/04/2012 by Dr. Carlean Purl: Normal colonoscopy  10 year recall     IMAGE STUDIES:                                                            Abdominal MRI 04/11/2020: 1. Dropout of hepatic signal on inphase images favoring hemochromatosis. 2. Previous liver nodule no longer seen. No worrisome hepatic lesions currently identified. 3. Right middle lobe nodule, not hypermetabolic on prior PET-CT, favoring benign etiology. 4. Abdominal aortic atherosclerosis. 5. Splenectomy.   Chest CTA/Abd/pelvic CT 04/11/2021: 1. No evidence of pulmonary artery embolism. 2. Extensive thickening of the distal thoracic esophagus and gastric antrum with evidence of perforation. Suspected site of focal mural discontinuity is seen near the diaphragmatic hiatus. Extensive surrounding inflammatory features are noted in the lower middle mediastinum as well as in the upper abdomen including within the peritoneum, lesser sac and retroperitoneum. Stranding along the descending colon is favored to be distributed inflammation with additional trace free fluid in the pericolic gutters and deep pelvis. 3. Some patchy opacity and ground-glass in the lingula and left lower lobe with several thickened and fluid-filled airways could suggest sequela of aspiration. 4. Trace bilateral effusions and dependent atelectasis. 5. Stable tubular lesion in the right middle lobe, suspect mucoid impaction of an airway given lack of FDG avidity on prior study. 6. Fat attenuation material at the duodenal bulb, could reflect ingestion of oily material, correlate with history. 7. Lobular fat containing paraumbilical ventral hernia with some stranding in the herniated fat and adjacent  omental fat external to the hernia sac, correlate for point tenderness to assess for mild fatty strangulation. 8. Focal region of hazy mesenteric stranding in the mid abdomen compatible with sequela prior mesenteritis, unchanged from prior. 9. Lobular hepatic surface contour, may reflect some cirrhotic change in this patient with known hemochromatosis. 10. Prior splenectomy 11. Aortic Atherosclerosis (ICD10-I70.0). 12. Coronary artery calcifications are present. Please note that the presence of coronary artery calcium documents the presence of coronary artery disease, the severity of this disease and any potential stenosis cannot be assessed on this non-gated CT examination.  CTAP 05/21/2021: 1. Persistent but decreased size of left subcapsular hepatic collection.  2. Decreased fluid and gas along the lesser curvature of the stomach at site of previously seen contrast extravasation. There is persistent fluid and contrast,, presumed residua of prior fluoroscopic exam. Assessment for persistent leak is limited on this examination is enteric contrast was not administered.  3. Similar position of cyst gastrostomy tubes projecting from the gastric cardia, no significant surrounding fluid in the left upper quadrant.  4. Decreased free fluid compared to prior.  5. Liquid stool in the colon, correlate for diarrhea.   Chest CT 04/11/2022: 1. Stable curvilinear density within the right middle lobe is consistent with mucoid impaction. Long-term stability consistent with benign etiology. No specific  follow-up is required. 2. Basilar predominant subpleural scarring and fibrosis. This could be related to changes due to underlying UIP or patient's known alpha-1 antitrypsin deficiency. 3. No acute intrathoracic process. 4. Aortic Atherosclerosis (ICD10-I70.0). Coronary artery atherosclerosis. 5. Cirrhosis.  Past Medical History:  Diagnosis Date   Allergy    Blood transfusion    Diabetes mellitus     DVT (deep venous thrombosis) (HCC)    right   GERD (gastroesophageal reflux disease)    Heart murmur    Hereditary spherocytosis ([place])    Hyperlipidemia    Hypertension    Stroke [facility])    x2 2007   Past Surgical History:  Procedure Laterality Date   COLONOSCOPY     SPLENECTOMY     SVT ABLATION N/A 06/19/2019   Procedure: SVT ABLATION;  Surgeon: Evans Lance, MD;  Location: [place] [facility];  Service: Cardiovascular;  Laterality: N/A;   UMBILICAL HERNIA REPAIR     VASECTOMY     Current Outpatient Medications on File Prior to Visit  Medication Sig Dispense Refill   acetaminophen (TYLENOL) 500 MG tablet Take 1,000 mg by mouth every 6 (six) hours as needed for moderate pain or headache.     aspirin EC 81 MG tablet Take 1 tablet (81 mg total) by mouth daily. Swallow whole. 90 tablet 3   Cholecalciferol (VITAMIN D3) 50 MCG (2000 UT) TABS Take 2,000 Units by mouth daily.     Dapagliflozin Propanediol (FARXIGA PO) Take by mouth.     EPINEPHrine 0.3 mg/0.3 mL IJ SOAJ injection Inject 0.3 mg into the muscle as needed for anaphylaxis (allergy shot).      fluticasone (FLONASE) 50 MCG/ACT nasal spray Place 1 spray into both nostrils daily.     Fluticasone-Sodium Chloride (DERMACINRX TICANASE PAK) 50-2.7 MCG/ACT-% THPK Place into the nose.     Insulin Glargine (BASAGLAR KWIKPEN) 100 UNIT/ML SMARTSIG:10 Unit(s) SUB-Q Daily     levothyroxine (SYNTHROID) 50 MCG tablet Take 50 mcg by mouth every morning.     Olopatadine HCl 0.6 % SOLN Place 2 sprays into the nose 2 (two) times daily.     OVER THE COUNTER MEDICATION Inject 1 Dose as directed once a week. Allergy Shots     pantoprazole sodium (PROTONIX) 40 mg/20 mL PACK Place 20 mLs into feeding tube daily.     Polyethyl Glycol-Propyl Glycol (SYSTANE) 0.4-0.3 % SOLN Place 1 drop into both eyes 3 (three) times daily as needed (dry/irritated eyes.).     rosuvastatin (CRESTOR) 40 MG tablet Take 40 mg by mouth daily.     UNIFINE PENTIPS 31G X 8 MM  MISC      Water For Irrigation, Sterile (STERILE WATER FOR IRRIGATION)      No current facility-administered medications on file prior to visit.   Allergies  Allergen Reactions   Other Other (See Comments)    Grass,Shrubs,Trees, Dust, Pollen    No Known Allergies     Current Medications, Allergies, Past Medical History, Past Surgical History, Family History and Social History were reviewed in [employer] record.  Review of Systems:   Constitutional: Negative for fever, sweats, chills or weight loss.  Respiratory: Negative for shortness of breath.   Cardiovascular: Negative for chest pain, palpitations and leg swelling.  Gastrointestinal: See HPI.  Musculoskeletal: Negative for back pain or muscle aches.  Neurological: Negative for dizziness, headaches or paresthesias.   Physical Exam: BP 118/60   Pulse (!) 49   Ht _0  (1.702 m)  Wt 212 lb 8 oz (96.4 kg)   BMI 33.28 kg/m  Wt Readings from Last 3 Encounters:  10/22/22 212 lb 8 oz (96.4 kg)  06/14/22 212 lb 3.2 oz (96.3 kg)  02/12/22 210 lb (95.3 kg)    General: 61 year old male in NAD.  Head: Normocephalic and atraumatic. Eyes: No scleral icterus. Conjunctiva pink . Ears: Normal auditory acuity. Mouth: Dentition intact. No ulcers or lesions.  Lungs: Clear throughout to auscultation. Heart: bradycardic, normal rhythm, no murmur. Abdomen: Soft, nontender and nondistended. No masses or hepatomegaly. Normal bowel sounds x 4 quadrants. Large periumbilical ventral abdominal hernia.  Rectal: Deferred.  Musculoskeletal: Symmetrical with no gross deformities. Extremities: No edema. Neurological: Alert oriented x 4. No focal deficits.  Psychological: Alert and cooperative. Normal mood and affect  Assessment and Recommendations:  45) 61 year old male with a history of a perforated esophagus (Boerhaave syndrome) 04/2021 s/p EGD with esophageal stent placement 04/11/2021 which required a prolonged  hospitalization and rehab. See HPI for hospitalization summary. A repeat EGD was done 07/03/2021 and the esophageal stents and GJ tube were removed. He remains on Pantoprazole 6m QD. No dysphagia or heartburn.  -I will consult with Dr. GCarlean Purlto verify if a repeat EGD is warranted at this time  -Continue Pantoprazole 468monce daily   2) Colon cancer screening. Normal colonoscopy 03/2012.  -Eventual colonoscopy  3) Cirrhosis per CT imaging. Possible cirrhosis secondary to hemochromatosis. History of a subscapular liver abscess s/p drainage per IR during his prolonged hospitalization at [facility] May - June 2022. Requested labs from PCP received. Labs 08/31/2022: Hg 13. HCT 30.5. PLT 316. Ferritin level 185. Goal Ferritin level < 50 recommended for patient's with hemochromatosis.  -RUQ sonogram  -Patient notified 08/31/2022 labs received and reviewed from Dr. ShRaul Delffice. Patient agreed to return to our lab 10/25/2022 to had the following labs done:  CMP, CBC, IBC + ferritin, Hep A total antibody, Hep B surface antigen, Hep B core total antibody, Hep B surface antibody, Hep C antibody, ANA, SMA, AMA, IgG level, PT/INR and AFP -Hematology consult for phlebotomy  -[facility] 4) Hemochromatosis  -See plan in # 3  5) History of hereditary spherocytosis s/p splenectomy as an infant  ======================================== Primary gastroenterologist:  Reviewed with CoJaclyn Shaggynd also the patient.  I called him.  I do not think he is going to meet criteria to have a screening EGD with respect to varices.  Screening colonoscopy may be scheduled at some point.  He has hemochromatosis and we will reestablish him with hematology, and instead of serologic work-up at this time for other disorders we will put the labs on hold and change ultrasound to an MRI for better definition.  He had a history of liver lesion in 2021 that came and went an MRI testing and I would like to make  sure he has no more nodules and to get a better characterization of his liver morphology.  Pending that we can do other studies which would include hepatitis serologies for immunity and INR. CaGatha MayerMD, FAMarval Regal

## 2022-10-22 NOTE — Patient Instructions (Signed)
You have been scheduled for an abdominal ultrasound at [facility] (1st floor of hospital) on 10/26/22 at 10 am. Please arrive 30 minutes prior to your appointment for registration. Make certain not to have anything to eat or drink after midnight. Should you need to reschedule your appointment, please contact radiology at [phone]. This test typically takes about 30 minutes to perform.   Due to recent changes in healthcare laws, you may see the results of your imaging and laboratory studies on MyChart before your provider has had a chance to review them.  We understand that in some cases there may be results that are confusing or concerning to you. Not all laboratory results come back in the same time frame and the provider may be waiting for multiple results in order to interpret others.  Please give [place] 48 hours in order for your provider to thoroughly review all the results before contacting the office for clarification of your results.    It was a pleasure to see you today!  Thank you for trusting me with your gastrointestinal care!

## 2022-10-24 ENCOUNTER — Encounter: Payer: Self-pay | Admitting: Nurse Practitioner

## 2022-10-25 ENCOUNTER — Telehealth: Payer: Self-pay | Admitting: Internal Medicine

## 2022-10-25 NOTE — Telephone Encounter (Signed)
Reviewed his recent office visit with Samara Stankowski Best, NP and also called the patient.  Very complicated medical history with prior Boerhaave syndrome and repair at [facility] (esophageal stenting).  Also has hemochromatosis and prior MRI imaging raising suggestion of cirrhosis and was recently raised on CT scanning again.  Platelets are normal.  Was previously getting phlebotomy through Dr. Irene Limbo.   Revision to work-up:  #1 cancel lab tests recently ordered-we will hold off on any of these patient is aware but please cancel these lab orders that are all pending for [facility]  #2 cancel ultrasound test for tomorrow-he will call but we should call as well.  #3 schedule MRI abdomen with and without contrast diagnosis hemochromatosis, abnormal CT liver  #4 place referral to Dr. Irene Limbo hematology-hemochromatosis, patient needs to reestablish ferritin is elevated   Once #3 is done and I review results I will contact him again to determine any neck steps.  I do not think he needs an EGD at this point as his platelets are normal and he does not really fit the category of someone at high risk of having varices.  He did not have any on the upper endoscopy procedures last year at [facility].  We will determine timing of a screening colonoscopy and any other labs as well.

## 2022-10-25 NOTE — Telephone Encounter (Signed)
Remo Lipps, thank you for getting all of Dr. Celesta Aver instructions done.

## 2022-10-26 ENCOUNTER — Ambulatory Visit (HOSPITAL_COMMUNITY): Admission: RE | Admit: 2022-10-26 | Payer: Federal, State, Local not specified - PPO | Source: Ambulatory Visit

## 2022-10-26 ENCOUNTER — Other Ambulatory Visit: Payer: Self-pay

## 2022-10-26 ENCOUNTER — Encounter (HOSPITAL_COMMUNITY): Payer: Self-pay

## 2022-10-26 DIAGNOSIS — R932 Abnormal findings on diagnostic imaging of liver and biliary tract: Secondary | ICD-10-CM

## 2022-10-26 DIAGNOSIS — R7989 Other specified abnormal findings of blood chemistry: Secondary | ICD-10-CM

## 2022-10-26 NOTE — Telephone Encounter (Signed)
Pt contacted and discussed Dr. Carlean Purl recommendations:  #1  all lab test were cancelled: Pt made aware  #2 Radiology Scheduling contacted and [place] canceled: Pt made aware  #3 Pt was ordered and  scheduled for an MRI at [facility] on 11/02/2022  at 4:00 PM. Pt to arrive at 3:45 PM :  No thing to eat or drink 4 hours prior: Pt made aware  #4 Referral  placed to Dr. Irene Limbo  at hematology: Pt was made aware if he has not heard from there office in 1 week to give our office a call back.    Pt verbalized understanding with all questions answered.

## 2022-10-29 DIAGNOSIS — J3089 Other allergic rhinitis: Secondary | ICD-10-CM | POA: Diagnosis not present

## 2022-10-29 DIAGNOSIS — J3081 Allergic rhinitis due to animal (cat) (dog) hair and dander: Secondary | ICD-10-CM | POA: Diagnosis not present

## 2022-10-29 DIAGNOSIS — J301 Allergic rhinitis due to pollen: Secondary | ICD-10-CM | POA: Diagnosis not present

## 2022-11-02 ENCOUNTER — Ambulatory Visit (HOSPITAL_COMMUNITY): Payer: Federal, State, Local not specified - PPO

## 2022-11-03 ENCOUNTER — Ambulatory Visit (HOSPITAL_COMMUNITY)
Admission: RE | Admit: 2022-11-03 | Discharge: 2022-11-03 | Disposition: A | Discharge status: Home / Self Care | Payer: Federal, State, Local not specified - PPO | Source: Ambulatory Visit | Attending: Internal Medicine | Admitting: Internal Medicine

## 2022-11-03 DIAGNOSIS — R932 Abnormal findings on diagnostic imaging of liver and biliary tract: Secondary | ICD-10-CM | POA: Insufficient documentation

## 2022-11-03 DIAGNOSIS — K8020 Calculus of gallbladder without cholecystitis without obstruction: Secondary | ICD-10-CM | POA: Diagnosis not present

## 2022-11-03 DIAGNOSIS — Z9081 Acquired absence of spleen: Secondary | ICD-10-CM | POA: Diagnosis not present

## 2022-11-03 MED ORDER — GADOBUTROL 1 MMOL/ML IV SOLN
9.5000 mL | Freq: Once | INTRAVENOUS | Status: AC | PRN
  Administered 2022-11-03: 9.5 mL via INTRAVENOUS

## 2022-11-09 ENCOUNTER — Other Ambulatory Visit: Payer: Self-pay

## 2022-11-09 DIAGNOSIS — R932 Abnormal findings on diagnostic imaging of liver and biliary tract: Secondary | ICD-10-CM

## 2022-11-09 DIAGNOSIS — Z1211 Encounter for screening for malignant neoplasm of colon: Secondary | ICD-10-CM

## 2022-11-19 DIAGNOSIS — J3081 Allergic rhinitis due to animal (cat) (dog) hair and dander: Secondary | ICD-10-CM | POA: Diagnosis not present

## 2022-11-19 DIAGNOSIS — J301 Allergic rhinitis due to pollen: Secondary | ICD-10-CM | POA: Diagnosis not present

## 2022-11-19 DIAGNOSIS — J3089 Other allergic rhinitis: Secondary | ICD-10-CM | POA: Diagnosis not present

## 2022-11-20 NOTE — Progress Notes (Unsigned)
[facility] History and Physical   Primary Care Physician:  Ginger Organ., MD   Reason for Procedure:  CRCAscreening  Plan:    colonoscopy     HPI: Kurt Lloyd is a 61 y.o. male here fior screening colonoscopy He has hx hemochromatosis and abnormal liver on imaging - possible cirrhosis   Past Medical History:  Diagnosis Date   Allergy    Blood transfusion    Diabetes mellitus    DVT (deep venous thrombosis) (HCC)    right   GERD (gastroesophageal reflux disease)    Heart murmur    Hereditary spherocytosis ([place])    Hyperlipidemia    Hypertension    Stroke [facility])    x2 2007    Past Surgical History:  Procedure Laterality Date   COLONOSCOPY     SPLENECTOMY     SVT ABLATION N/A 06/19/2019   Procedure: SVT ABLATION;  Surgeon: Evans Lance, MD;  Location: [place] [facility];  Service: Cardiovascular;  Laterality: N/A;   UMBILICAL HERNIA REPAIR     VASECTOMY      Prior to Admission medications   Medication Sig Start Date End Date Taking? Authorizing Provider  acetaminophen (TYLENOL) 500 MG tablet Take 1,000 mg by mouth every 6 (six) hours as needed for moderate pain or headache.    [provider]  aspirin EC 81 MG tablet Take 1 tablet (81 mg total) by mouth daily. Swallow whole. 07/21/21   Evans Lance, MD  Cholecalciferol (VITAMIN D3) 50 MCG (2000 UT) TABS Take 2,000 Units by mouth daily.    [provider]  Dapagliflozin Propanediol (FARXIGA PO) Take by mouth.    [provider]  EPINEPHrine 0.3 mg/0.3 mL IJ SOAJ injection Inject 0.3 mg into the muscle as needed for anaphylaxis (allergy shot).  01/03/19   [provider]  fluticasone (FLONASE) 50 MCG/ACT nasal spray Place 1 spray into both nostrils daily.    [provider]  Fluticasone-Sodium Chloride ([place] TICANASE PAK) 50-2.7 MCG/ACT-% THPK Place into the nose. 10/18/20   [provider]  Insulin Glargine (BASAGLAR KWIKPEN) 100  UNIT/ML SMARTSIG:10 Unit(s) SUB-Q Daily 04/23/22   [provider]  levothyroxine (SYNTHROID) 50 MCG tablet Take 50 mcg by mouth every morning. 03/27/22   [provider]  Olopatadine HCl 0.6 % SOLN Place 2 sprays into the nose 2 (two) times daily.    [provider]  OVER THE COUNTER MEDICATION Inject 1 Dose as directed once a week. Allergy Shots    [provider]  pantoprazole sodium (PROTONIX) 40 mg/20 mL PACK Place 20 mLs into feeding tube daily.    [provider]  Polyethyl Glycol-Propyl Glycol (SYSTANE) 0.4-0.3 % SOLN Place 1 drop into both eyes 3 (three) times daily as needed (dry/irritated eyes.).    [provider]  rosuvastatin (CRESTOR) 40 MG tablet Take 40 mg by mouth daily.    [provider]  UNIFINE PENTIPS 31G X 8 MM MISC  05/24/21   [provider]  Water For Irrigation, Sterile (STERILE WATER FOR IRRIGATION)  05/23/21   [provider]    Current Outpatient Medications  Medication Sig Dispense Refill   aspirin EC 81 MG tablet Take 1 tablet (81 mg total) by mouth daily. Swallow whole. 90 tablet 3   Cholecalciferol (VITAMIN D3) 50 MCG (2000 UT) TABS Take 2,000 Units by mouth daily.     Dapagliflozin Propanediol (FARXIGA PO) Take by mouth.     fluticasone (  FLONASE) 50 MCG/ACT nasal spray Place 1 spray into both nostrils daily.     Fluticasone-Sodium Chloride (DERMACINRX TICANASE PAK) 50-2.7 MCG/ACT-% THPK Place into the nose.     Insulin Glargine (BASAGLAR KWIKPEN) 100 UNIT/ML SMARTSIG:10 Unit(s) SUB-Q Daily     levothyroxine (SYNTHROID) 50 MCG tablet Take 50 mcg by mouth every morning.     pantoprazole sodium (PROTONIX) 40 mg/20 mL PACK Place 20 mLs into feeding tube daily.     rosuvastatin (CRESTOR) 40 MG tablet Take 40 mg by mouth daily.     acetaminophen (TYLENOL) 500 MG tablet Take 1,000 mg by mouth every 6 (six) hours as needed for moderate pain or headache.     EPINEPHrine 0.3 mg/0.3 mL IJ SOAJ  injection Inject 0.3 mg into the muscle as needed for anaphylaxis (allergy shot).      losartan (COZAAR) 25 MG tablet Take 25 mg by mouth daily.     Olopatadine HCl 0.6 % SOLN Place 2 sprays into the nose 2 (two) times daily.     OVER THE COUNTER MEDICATION Inject 1 Dose as directed once a week. Allergy Shots     Polyethyl Glycol-Propyl Glycol (SYSTANE) 0.4-0.3 % SOLN Place 1 drop into both eyes 3 (three) times daily as needed (dry/irritated eyes.).     UNIFINE PENTIPS 31G X 8 MM MISC      Water For Irrigation, Sterile (STERILE WATER FOR IRRIGATION)      Current Facility-Administered Medications  Medication Dose Route Frequency Provider Last Rate Last Admin   0.9 %  sodium chloride infusion  500 mL Intravenous Once Gatha Mayer, MD        Allergies as of 11/21/2022 - Review Complete 11/21/2022  Allergen Reaction Noted   Other Other (See Comments) 11/23/2020   No known allergies  04/11/2021    Family History  Problem Relation Age of Onset   Stroke Mother    Lung cancer Father    Hypertension Father    Crohn's disease Daughter    Colon cancer Neg Hx    Esophageal cancer Neg Hx    Rectal cancer Neg Hx    Stomach cancer Neg Hx     Social History   Socioeconomic History   Marital status: Married    Spouse name: Not on file   Number of children: 2   Years of education: Not on file   Highest education level: Not on file  Occupational History   Occupation: retired  Tobacco Use   Smoking status: Never   Smokeless tobacco: Never  Vaping Use   Vaping Use: Never used  Substance and Sexual Activity   Alcohol use: Yes    Comment: occasionally   Drug use: No   Sexual activity: Not on file  Other Topics Concern   Not on file  Social History Narrative   Not on file   Social Determinants of Health   Financial Resource Strain: Not on file  Food Insecurity: Not on file  Transportation Needs: Not on file  Physical Activity: Not on file  Stress: Not on file  Social  Connections: Not on file  Intimate Partner Violence: Not on file    Review of Systems:  All other review of systems negative except as mentioned in the HPI.  Physical Exam: Vital signs BP (!) 139/59   Pulse (!) 55   Temp 97.8 F (36.6 C)   Resp 16   Ht '5\' 7"'$  (1.702 m)   Wt 212 lb (96.2 kg)   SpO2  98%   BMI 33.20 kg/m   General:   Alert,  Well-developed, well-nourished, pleasant and cooperative in NAD Lungs:  Clear throughout to auscultation.   Heart:  Regular rate and rhythm; no murmurs, clicks, rubs,  or gallops. Abdomen:  Soft, nontender and nondistended. Normal bowel sounds.  Small, soft umbilical hernia Neuro/Psych:  Alert and cooperative. Normal mood and affect. A and O x 3   '@Teoman Giraud'$  Simonne Maffucci, MD, [facility] [phone] (pager) 11/21/2022 8:56 AM@

## 2022-11-21 ENCOUNTER — Ambulatory Visit (AMBULATORY_SURGERY_CENTER): Payer: Federal, State, Local not specified - PPO | Admitting: Internal Medicine

## 2022-11-21 ENCOUNTER — Encounter: Payer: Self-pay | Admitting: Internal Medicine

## 2022-11-21 ENCOUNTER — Other Ambulatory Visit: Payer: Federal, State, Local not specified - PPO

## 2022-11-21 VITALS — BP 122/51 | HR 62 | Temp 97.8°F | Resp 14 | Ht 67.0 in | Wt 212.0 lb

## 2022-11-21 DIAGNOSIS — R932 Abnormal findings on diagnostic imaging of liver and biliary tract: Secondary | ICD-10-CM | POA: Diagnosis not present

## 2022-11-21 DIAGNOSIS — D123 Benign neoplasm of transverse colon: Secondary | ICD-10-CM

## 2022-11-21 DIAGNOSIS — Z1211 Encounter for screening for malignant neoplasm of colon: Secondary | ICD-10-CM

## 2022-11-21 MED ORDER — SODIUM CHLORIDE 0.9 % IV SOLN: 500.0000 mL | Freq: Once | INTRAVENOUS | Status: DC

## 2022-11-21 NOTE — Op Note (Signed)
[facility] Patient Name: Kurt Lloyd Procedure Date: 11/21/2022 8:45 AM MRN: [id number] Endoscopist: Gatha Mayer , MD, [id number] Age: 61 Referring MD:  Date of Birth: 1961-09-19 Gender: Male Account #: [id number] Procedure:                Colonoscopy Indications:              Screening for colorectal malignant neoplasm, Last                            colonoscopy: 2013 Medicines:                Monitored Anesthesia Care Procedure:                Pre-Anesthesia Assessment:                           - Prior to the procedure, a History and Physical                            was performed, and patient medications and                            allergies were reviewed. The patient's tolerance of                            previous anesthesia was also reviewed. The risks                            and benefits of the procedure and the sedation                            options and risks were discussed with the patient.                            All questions were answered, and informed consent                            was obtained. Prior Anticoagulants: The patient has                            taken no anticoagulant or antiplatelet agents. ASA                            Grade Assessment: II - A patient with mild systemic                            disease. After reviewing the risks and benefits,                            the patient was deemed in satisfactory condition to                            undergo the procedure.  After obtaining informed consent, the colonoscope                            was passed under direct vision. Throughout the                            procedure, the patient's blood pressure, pulse, and                            oxygen saturations were monitored continuously. The                            CF [id number] #[id number] was introduced through the anus                            and advanced to the the cecum,  identified by                            appendiceal orifice and ileocecal valve. The                            colonoscopy was performed without difficulty. The                            patient tolerated the procedure well. The quality                            of the bowel preparation was excellent. The                            ileocecal valve, appendiceal orifice, and rectum                            were photographed. The bowel preparation used was                            Miralax via split dose instruction. Scope In: 9:04:18 AM Scope Out: 9:17:42 AM Scope Withdrawal Time: 0 hours 10 minutes 31 seconds  Total Procedure Duration: 0 hours 13 minutes 24 seconds  Findings:                 The perianal and digital rectal examinations were                            normal. Pertinent negatives include normal prostate                            (size, shape, and consistency).                           A diminutive polyp was found in the transverse                            colon. The polyp was sessile. The polyp was removed  with a cold snare. Resection and retrieval were                            complete. Verification of patient identification                            for the specimen was done. Estimated blood loss was                            minimal.                           The exam was otherwise without abnormality on                            direct and retroflexion views. Complications:            No immediate complications. Estimated Blood Loss:     Estimated blood loss was minimal. Impression:               - One diminutive polyp in the transverse colon,                            removed with a cold snare. Resected and retrieved.                           - The examination was otherwise normal on direct                            and retroflexion views. Recommendation:           - Patient has a contact number available for                             emergencies. The signs and symptoms of potential                            delayed complications were discussed with the                            patient. Return to normal activities tomorrow.                            Written discharge instructions were provided to the                            patient.                           - Resume previous diet.                           - Continue present medications.                           - Repeat colonoscopy is recommended. The  colonoscopy date will be determined after pathology                            results from today's exam become available for                            review. Gatha Mayer, MD 11/21/2022 9:25:04 AM This report has been signed electronically.

## 2022-11-21 NOTE — Patient Instructions (Addendum)
I found and removed one tiny polyp that looks benign.  I will let you know pathology results and when to have another routine colonoscopy by mail and/or My Chart.  Please go to lab today and have blood work drawn (checking immunity to hepatitis A and B)  Please resume regular medications and diet.  I appreciate the opportunity to care for you. Gatha Mayer, MD, [facility]  Handout on polyp provided   YOU HAD AN ENDOSCOPIC PROCEDURE TODAY AT [facility]:   Refer to the procedure report that was given to you for any specific questions about what was found during the examination.  If the procedure report does not answer your questions, please call your gastroenterologist to clarify.  If you requested that your care partner not be given the details of your procedure findings, then the procedure report has been included in a sealed envelope for you to review at your convenience later.  YOU SHOULD EXPECT: Some feelings of bloating in the abdomen. Passage of more gas than usual.  Walking can help get rid of the air that was put into your GI tract during the procedure and reduce the bloating. If you had a lower endoscopy (such as a colonoscopy or flexible sigmoidoscopy) you may notice spotting of blood in your stool or on the toilet paper. If you underwent a bowel prep for your procedure, you may not have a normal bowel movement for a few days.  Please Note:  You might notice some irritation and congestion in your nose or some drainage.  This is from the oxygen used during your procedure.  There is no need for concern and it should clear up in a day or so.  SYMPTOMS TO REPORT IMMEDIATELY:  Following lower endoscopy (colonoscopy or flexible sigmoidoscopy):  Excessive amounts of blood in the stool  Significant tenderness or worsening of abdominal pains  Swelling of the abdomen that is new, acute  Fever of 100F or higher  For urgent or emergent issues, a gastroenterologist can be  reached at any hour by calling [phone]. Do not use MyChart messaging for urgent concerns.    DIET:  We do recommend a small meal at first, but then you may proceed to your regular diet.  Drink plenty of fluids but you should avoid alcoholic beverages for 24 hours.  ACTIVITY:  You should plan to take it easy for the rest of today and you should NOT DRIVE or use heavy machinery until tomorrow (because of the sedation medicines used during the test).    FOLLOW UP: Our staff will call the number listed on your records the next business day following your procedure.  We will call around 7:15- 8:00 am to check on you and address any questions or concerns that you may have regarding the information given to you following your procedure. If we do not reach you, we will leave a message.     If any biopsies were taken you will be contacted by phone or by letter within the next 1-3 weeks.  Please call us at [phone] if you have not heard about the biopsies in 3 weeks.    SIGNATURES/CONFIDENTIALITY: You and/or your care partner have signed paperwork which will be entered into your electronic medical record.  These signatures attest to the fact that that the information above on your After Visit Summary has been reviewed and is understood.  Full responsibility of the confidentiality of this discharge information lies with you and/or your  care-partner.  

## 2022-11-21 NOTE — Progress Notes (Signed)
Called to room to assist during endoscopic procedure.  Patient ID and intended procedure confirmed with present staff. Received instructions for my participation in the procedure from the performing physician.  

## 2022-11-21 NOTE — Progress Notes (Signed)
Report to pacu rn. Vss. Care resumed by rn. 

## 2022-11-21 NOTE — Progress Notes (Signed)
Pt's states no medical or surgical changes since previsit or office visit. 

## 2022-11-22 ENCOUNTER — Telehealth: Payer: Self-pay | Admitting: *Deleted

## 2022-11-22 LAB — HEPATITIS B SURFACE ANTIGEN: Hepatitis B Surface Ag: NONREACTIVE

## 2022-11-22 LAB — HEPATITIS A ANTIBODY, TOTAL: Hepatitis A AB,Total: REACTIVE — AB

## 2022-11-22 LAB — HEPATITIS B CORE ANTIBODY, TOTAL: Hep B Core Total Ab: NONREACTIVE

## 2022-11-22 LAB — HEPATITIS B SURFACE ANTIBODY,QUALITATIVE: Hep B S Ab: REACTIVE — AB

## 2022-11-22 NOTE — Telephone Encounter (Signed)
  Follow up Call-     11/21/2022    8:18 AM  Call back number  Post procedure Call Back phone  # [phone]  Permission to leave phone message Yes     Patient questions:  Do you have a fever, pain , or abdominal swelling? No. Pain Score  0 *  Have you tolerated food without any problems? Yes.    Have you been able to return to your normal activities? Yes.    Do you have any questions about your discharge instructions: Diet   No. Medications  No. Follow up visit  No.  Do you have questions or concerns about your Care? No.  Actions: * If pain score is 4 or above: No action needed, pain <4.

## 2022-11-26 DIAGNOSIS — J301 Allergic rhinitis due to pollen: Secondary | ICD-10-CM | POA: Diagnosis not present

## 2022-11-26 DIAGNOSIS — J3081 Allergic rhinitis due to animal (cat) (dog) hair and dander: Secondary | ICD-10-CM | POA: Diagnosis not present

## 2022-11-26 DIAGNOSIS — J3089 Other allergic rhinitis: Secondary | ICD-10-CM | POA: Diagnosis not present

## 2022-11-29 ENCOUNTER — Encounter: Payer: Self-pay | Admitting: Internal Medicine

## 2022-11-29 DIAGNOSIS — Z86010 Personal history of colonic polyps: Secondary | ICD-10-CM | POA: Insufficient documentation

## 2022-11-29 DIAGNOSIS — Z860101 Personal history of adenomatous and serrated colon polyps: Secondary | ICD-10-CM

## 2022-11-29 HISTORY — DX: Personal history of adenomatous and serrated colon polyps: Z86.0101

## 2022-12-05 ENCOUNTER — Ambulatory Visit: Payer: Federal, State, Local not specified - PPO | Admitting: Hematology

## 2022-12-05 ENCOUNTER — Other Ambulatory Visit: Payer: Federal, State, Local not specified - PPO

## 2022-12-07 DIAGNOSIS — J301 Allergic rhinitis due to pollen: Secondary | ICD-10-CM | POA: Diagnosis not present

## 2022-12-07 DIAGNOSIS — J3089 Other allergic rhinitis: Secondary | ICD-10-CM | POA: Diagnosis not present

## 2022-12-07 DIAGNOSIS — J3081 Allergic rhinitis due to animal (cat) (dog) hair and dander: Secondary | ICD-10-CM | POA: Diagnosis not present

## 2022-12-13 DIAGNOSIS — E1129 Type 2 diabetes mellitus with other diabetic kidney complication: Secondary | ICD-10-CM | POA: Diagnosis not present

## 2022-12-13 DIAGNOSIS — I129 Hypertensive chronic kidney disease with stage 1 through stage 4 chronic kidney disease, or unspecified chronic kidney disease: Secondary | ICD-10-CM | POA: Diagnosis not present

## 2022-12-19 DIAGNOSIS — J3089 Other allergic rhinitis: Secondary | ICD-10-CM | POA: Diagnosis not present

## 2022-12-19 DIAGNOSIS — J3081 Allergic rhinitis due to animal (cat) (dog) hair and dander: Secondary | ICD-10-CM | POA: Diagnosis not present

## 2022-12-19 DIAGNOSIS — J301 Allergic rhinitis due to pollen: Secondary | ICD-10-CM | POA: Diagnosis not present

## 2022-12-20 ENCOUNTER — Other Ambulatory Visit: Payer: Self-pay

## 2022-12-21 ENCOUNTER — Inpatient Hospital Stay (HOSPITAL_BASED_OUTPATIENT_CLINIC_OR_DEPARTMENT_OTHER): Payer: Federal, State, Local not specified - PPO | Admitting: Hematology

## 2022-12-21 ENCOUNTER — Inpatient Hospital Stay: Payer: Federal, State, Local not specified - PPO | Attending: Hematology

## 2022-12-21 DIAGNOSIS — N189 Chronic kidney disease, unspecified: Secondary | ICD-10-CM | POA: Insufficient documentation

## 2022-12-21 LAB — CMP (CANCER CENTER ONLY)
ALT: 19 U/L (ref 0–44)
AST: 25 U/L (ref 15–41)
Albumin: 3.9 g/dL (ref 3.5–5.0)
Alkaline Phosphatase: 87 U/L (ref 38–126)
Anion gap: 4 — ABNORMAL LOW (ref 5–15)
BUN: 33 mg/dL — ABNORMAL HIGH (ref 8–23)
CO2: 26 mmol/L (ref 22–32)
Calcium: 9.5 mg/dL (ref 8.9–10.3)
Chloride: 107 mmol/L (ref 98–111)
Creatinine: 1.94 mg/dL — ABNORMAL HIGH (ref 0.61–1.24)
GFR, Estimated: 38 mL/min — ABNORMAL LOW (ref >60)
Glucose, Bld: 177 mg/dL — ABNORMAL HIGH (ref 70–99)
Potassium: 4.9 mmol/L (ref 3.5–5.1)
Sodium: 137 mmol/L (ref 135–145)
Total Bilirubin: 0.8 mg/dL (ref 0.3–1.2)
Total Protein: 7.4 g/dL (ref 6.5–8.1)

## 2022-12-21 LAB — CBC WITH DIFFERENTIAL (CANCER CENTER ONLY)
Abs Immature Granulocytes: 0.01 10*3/uL (ref 0.00–0.07)
Basophils Absolute: 0.1 10*3/uL (ref 0.0–0.1)
Basophils Relative: 2 %
Eosinophils Absolute: 0.3 10*3/uL (ref 0.0–0.5)
Eosinophils Relative: 4 %
HCT: 35.9 % — ABNORMAL LOW (ref 39.0–52.0)
Hemoglobin: 13.4 g/dL (ref 13.0–17.0)
Immature Granulocytes: 0 %
Lymphocytes Relative: 33 %
Lymphs Abs: 2.6 10*3/uL (ref 0.7–4.0)
MCH: 36.3 pg — ABNORMAL HIGH (ref 26.0–34.0)
MCHC: 37.3 g/dL — ABNORMAL HIGH (ref 30.0–36.0)
MCV: 97.3 fL (ref 80.0–100.0)
Monocytes Absolute: 0.8 10*3/uL (ref 0.1–1.0)
Monocytes Relative: 10 %
Neutro Abs: 4 10*3/uL (ref 1.7–7.7)
Neutrophils Relative %: 51 %
Platelet Count: 305 10*3/uL (ref 150–400)
RBC: 3.69 MIL/uL — ABNORMAL LOW (ref 4.22–5.81)
RDW: 12.5 % (ref 11.5–15.5)
WBC Count: 7.9 10*3/uL (ref 4.0–10.5)
nRBC: 0 % (ref 0.0–0.2)

## 2022-12-21 LAB — IRON AND IRON BINDING CAPACITY (CC-WL,HP ONLY)
Iron: 179 ug/dL (ref 45–182)
Saturation Ratios: 59 % — ABNORMAL HIGH (ref 17.9–39.5)
TIBC: 305 ug/dL (ref 250–450)
UIBC: 126 ug/dL (ref 117–376)

## 2022-12-21 LAB — FERRITIN: Ferritin: 91 ng/mL (ref 24–336)
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
## 2022-12-25 DIAGNOSIS — J301 Allergic rhinitis due to pollen: Secondary | ICD-10-CM | POA: Diagnosis not present

## 2022-12-25 DIAGNOSIS — J3081 Allergic rhinitis due to animal (cat) (dog) hair and dander: Secondary | ICD-10-CM | POA: Diagnosis not present

## 2022-12-25 DIAGNOSIS — J3089 Other allergic rhinitis: Secondary | ICD-10-CM | POA: Diagnosis not present

## 2022-12-27 ENCOUNTER — Encounter: Payer: Self-pay | Admitting: Hematology

## 2023-01-17 ENCOUNTER — Encounter (HOSPITAL_COMMUNITY): Payer: Self-pay | Admitting: *Deleted

## 2023-01-21 DIAGNOSIS — J301 Allergic rhinitis due to pollen: Secondary | ICD-10-CM | POA: Diagnosis not present

## 2023-01-21 DIAGNOSIS — J3081 Allergic rhinitis due to animal (cat) (dog) hair and dander: Secondary | ICD-10-CM | POA: Diagnosis not present

## 2023-01-21 DIAGNOSIS — J3089 Other allergic rhinitis: Secondary | ICD-10-CM | POA: Diagnosis not present

## 2023-01-23 DIAGNOSIS — I129 Hypertensive chronic kidney disease with stage 1 through stage 4 chronic kidney disease, or unspecified chronic kidney disease: Secondary | ICD-10-CM | POA: Diagnosis not present

## 2023-01-23 DIAGNOSIS — E1122 Type 2 diabetes mellitus with diabetic chronic kidney disease: Secondary | ICD-10-CM | POA: Diagnosis not present

## 2023-01-23 DIAGNOSIS — N1831 Chronic kidney disease, stage 3a: Secondary | ICD-10-CM | POA: Diagnosis not present

## 2023-02-01 DIAGNOSIS — J3089 Other allergic rhinitis: Secondary | ICD-10-CM | POA: Diagnosis not present

## 2023-02-01 DIAGNOSIS — J3081 Allergic rhinitis due to animal (cat) (dog) hair and dander: Secondary | ICD-10-CM | POA: Diagnosis not present

## 2023-02-01 DIAGNOSIS — J301 Allergic rhinitis due to pollen: Secondary | ICD-10-CM | POA: Diagnosis not present

## 2023-02-06 DIAGNOSIS — J3081 Allergic rhinitis due to animal (cat) (dog) hair and dander: Secondary | ICD-10-CM | POA: Diagnosis not present

## 2023-02-06 DIAGNOSIS — J3089 Other allergic rhinitis: Secondary | ICD-10-CM | POA: Diagnosis not present

## 2023-02-06 DIAGNOSIS — J301 Allergic rhinitis due to pollen: Secondary | ICD-10-CM | POA: Diagnosis not present

## 2023-02-07 ENCOUNTER — Encounter: Payer: Self-pay | Admitting: Hematology

## 2023-02-13 DIAGNOSIS — J3089 Other allergic rhinitis: Secondary | ICD-10-CM | POA: Diagnosis not present

## 2023-02-13 DIAGNOSIS — J301 Allergic rhinitis due to pollen: Secondary | ICD-10-CM | POA: Diagnosis not present

## 2023-02-13 DIAGNOSIS — J3081 Allergic rhinitis due to animal (cat) (dog) hair and dander: Secondary | ICD-10-CM | POA: Diagnosis not present

## 2023-02-18 DIAGNOSIS — E1129 Type 2 diabetes mellitus with other diabetic kidney complication: Secondary | ICD-10-CM | POA: Diagnosis not present

## 2023-02-18 DIAGNOSIS — I700 Atherosclerosis of aorta: Secondary | ICD-10-CM | POA: Diagnosis not present

## 2023-02-18 DIAGNOSIS — I480 Paroxysmal atrial fibrillation: Secondary | ICD-10-CM | POA: Diagnosis not present

## 2023-02-18 DIAGNOSIS — J849 Interstitial pulmonary disease, unspecified: Secondary | ICD-10-CM | POA: Diagnosis not present

## 2023-02-18 DIAGNOSIS — I129 Hypertensive chronic kidney disease with stage 1 through stage 4 chronic kidney disease, or unspecified chronic kidney disease: Secondary | ICD-10-CM | POA: Diagnosis not present

## 2023-02-21 DIAGNOSIS — J3089 Other allergic rhinitis: Secondary | ICD-10-CM | POA: Diagnosis not present

## 2023-02-21 DIAGNOSIS — J3081 Allergic rhinitis due to animal (cat) (dog) hair and dander: Secondary | ICD-10-CM | POA: Diagnosis not present

## 2023-02-21 DIAGNOSIS — J301 Allergic rhinitis due to pollen: Secondary | ICD-10-CM | POA: Diagnosis not present

## 2023-03-06 DIAGNOSIS — J3081 Allergic rhinitis due to animal (cat) (dog) hair and dander: Secondary | ICD-10-CM | POA: Diagnosis not present

## 2023-03-06 DIAGNOSIS — J3089 Other allergic rhinitis: Secondary | ICD-10-CM | POA: Diagnosis not present

## 2023-03-06 DIAGNOSIS — J301 Allergic rhinitis due to pollen: Secondary | ICD-10-CM | POA: Diagnosis not present

## 2023-03-21 DIAGNOSIS — J301 Allergic rhinitis due to pollen: Secondary | ICD-10-CM | POA: Diagnosis not present

## 2023-03-21 DIAGNOSIS — J3081 Allergic rhinitis due to animal (cat) (dog) hair and dander: Secondary | ICD-10-CM | POA: Diagnosis not present

## 2023-03-21 DIAGNOSIS — J3089 Other allergic rhinitis: Secondary | ICD-10-CM | POA: Diagnosis not present

## 2023-04-02 DIAGNOSIS — J3081 Allergic rhinitis due to animal (cat) (dog) hair and dander: Secondary | ICD-10-CM | POA: Diagnosis not present

## 2023-04-02 DIAGNOSIS — J3089 Other allergic rhinitis: Secondary | ICD-10-CM | POA: Diagnosis not present

## 2023-04-02 DIAGNOSIS — J301 Allergic rhinitis due to pollen: Secondary | ICD-10-CM | POA: Diagnosis not present

## 2023-04-11 DIAGNOSIS — J3081 Allergic rhinitis due to animal (cat) (dog) hair and dander: Secondary | ICD-10-CM | POA: Diagnosis not present

## 2023-04-11 DIAGNOSIS — J3089 Other allergic rhinitis: Secondary | ICD-10-CM | POA: Diagnosis not present

## 2023-04-11 DIAGNOSIS — J301 Allergic rhinitis due to pollen: Secondary | ICD-10-CM | POA: Diagnosis not present

## 2023-04-16 ENCOUNTER — Ambulatory Visit: Payer: Federal, State, Local not specified - PPO | Admitting: Internal Medicine

## 2023-04-16 ENCOUNTER — Encounter: Payer: Self-pay | Admitting: Internal Medicine

## 2023-04-16 ENCOUNTER — Ambulatory Visit (INDEPENDENT_AMBULATORY_CARE_PROVIDER_SITE_OTHER): Payer: Federal, State, Local not specified - PPO | Admitting: Internal Medicine

## 2023-04-16 VITALS — BP 112/60 | HR 55 | Ht 67.0 in | Wt 207.2 lb

## 2023-04-16 DIAGNOSIS — J3089 Other allergic rhinitis: Secondary | ICD-10-CM | POA: Diagnosis not present

## 2023-04-16 DIAGNOSIS — J849 Interstitial pulmonary disease, unspecified: Secondary | ICD-10-CM

## 2023-04-16 DIAGNOSIS — J3081 Allergic rhinitis due to animal (cat) (dog) hair and dander: Secondary | ICD-10-CM | POA: Diagnosis not present

## 2023-04-16 DIAGNOSIS — J301 Allergic rhinitis due to pollen: Secondary | ICD-10-CM | POA: Diagnosis not present

## 2023-04-16 LAB — PULMONARY FUNCTION TEST
DL/VA % pred: 74 %
DL/VA: 3.12 ml/min/mmHg/L
DLCO cor % pred: 69 %
DLCO cor: 16.97 ml/min/mmHg
DLCO unc % pred: 69 %
DLCO unc: 16.97 ml/min/mmHg
FEF 25-75 Pre: 3.5 L/sec
FEF2575-%Pred-Pre: 139 %
FEV1-%Pred-Pre: 96 %
FEV1-Pre: 3 L
FEV1FVC-%Pred-Pre: 109 %
FEV6-%Pred-Pre: 92 %
FEV6-Pre: 3.63 L
FEV6FVC-%Pred-Pre: 105 %
FVC-%Pred-Pre: 88 %
FVC-Pre: 3.65 L
Pre FEV1/FVC ratio: 82 %
Pre FEV6/FVC Ratio: 100 %
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
## 2023-04-16 NOTE — Patient Instructions (Signed)
Spiro/DLCO performed today.  

## 2023-04-16 NOTE — Patient Instructions (Addendum)
  ILD  - is very mild and stable over time clinically but PFT 2024 has some negative variation since 201   Plan - do HRCT supine and prone in next 1-4 weeks   Followup  - 4 weeks video visit with Dr Marchelle Gearing to discuss C results

## 2023-04-16 NOTE — Addendum Note (Signed)
Addended by: Hedda Slade on: 04/16/2023 02:07 PM   Modules accepted: Orders

## 2023-04-16 NOTE — Progress Notes (Signed)
Spiro/DLCO performed today.  

## 2023-05-02 DIAGNOSIS — J301 Allergic rhinitis due to pollen: Secondary | ICD-10-CM | POA: Diagnosis not present

## 2023-05-02 DIAGNOSIS — J3081 Allergic rhinitis due to animal (cat) (dog) hair and dander: Secondary | ICD-10-CM | POA: Diagnosis not present

## 2023-05-02 DIAGNOSIS — I129 Hypertensive chronic kidney disease with stage 1 through stage 4 chronic kidney disease, or unspecified chronic kidney disease: Secondary | ICD-10-CM | POA: Diagnosis not present

## 2023-05-02 DIAGNOSIS — N1831 Chronic kidney disease, stage 3a: Secondary | ICD-10-CM | POA: Diagnosis not present

## 2023-05-02 DIAGNOSIS — J3089 Other allergic rhinitis: Secondary | ICD-10-CM | POA: Diagnosis not present

## 2023-05-02 DIAGNOSIS — E1122 Type 2 diabetes mellitus with diabetic chronic kidney disease: Secondary | ICD-10-CM | POA: Diagnosis not present

## 2023-05-15 DIAGNOSIS — I129 Hypertensive chronic kidney disease with stage 1 through stage 4 chronic kidney disease, or unspecified chronic kidney disease: Secondary | ICD-10-CM | POA: Diagnosis not present

## 2023-05-15 DIAGNOSIS — J3081 Allergic rhinitis due to animal (cat) (dog) hair and dander: Secondary | ICD-10-CM | POA: Diagnosis not present

## 2023-05-15 DIAGNOSIS — E1129 Type 2 diabetes mellitus with other diabetic kidney complication: Secondary | ICD-10-CM | POA: Diagnosis not present

## 2023-05-15 DIAGNOSIS — J3089 Other allergic rhinitis: Secondary | ICD-10-CM | POA: Diagnosis not present

## 2023-05-15 DIAGNOSIS — J301 Allergic rhinitis due to pollen: Secondary | ICD-10-CM | POA: Diagnosis not present

## 2023-05-20 ENCOUNTER — Ambulatory Visit (HOSPITAL_BASED_OUTPATIENT_CLINIC_OR_DEPARTMENT_OTHER)
Admission: RE | Admit: 2023-05-20 | Discharge: 2023-05-20 | Disposition: A | Discharge status: Home / Self Care | Payer: Federal, State, Local not specified - PPO | Source: Ambulatory Visit | Attending: Internal Medicine | Admitting: Internal Medicine

## 2023-05-20 ENCOUNTER — Encounter (HOSPITAL_BASED_OUTPATIENT_CLINIC_OR_DEPARTMENT_OTHER): Payer: Self-pay

## 2023-05-20 DIAGNOSIS — J479 Bronchiectasis, uncomplicated: Secondary | ICD-10-CM | POA: Diagnosis not present

## 2023-05-20 DIAGNOSIS — J849 Interstitial pulmonary disease, unspecified: Secondary | ICD-10-CM | POA: Insufficient documentation

## 2023-05-20 DIAGNOSIS — R918 Other nonspecific abnormal finding of lung field: Secondary | ICD-10-CM | POA: Diagnosis not present

## 2023-05-21 DIAGNOSIS — E11319 Type 2 diabetes mellitus with unspecified diabetic retinopathy without macular edema: Secondary | ICD-10-CM | POA: Diagnosis not present

## 2023-05-21 DIAGNOSIS — J3081 Allergic rhinitis due to animal (cat) (dog) hair and dander: Secondary | ICD-10-CM | POA: Diagnosis not present

## 2023-05-21 DIAGNOSIS — J3089 Other allergic rhinitis: Secondary | ICD-10-CM | POA: Diagnosis not present

## 2023-05-21 DIAGNOSIS — J301 Allergic rhinitis due to pollen: Secondary | ICD-10-CM | POA: Diagnosis not present

## 2023-05-23 ENCOUNTER — Telehealth (INDEPENDENT_AMBULATORY_CARE_PROVIDER_SITE_OTHER): Payer: Federal, State, Local not specified - PPO | Admitting: Internal Medicine

## 2023-05-23 ENCOUNTER — Encounter: Payer: Self-pay | Admitting: Internal Medicine

## 2023-05-23 ENCOUNTER — Telehealth: Payer: Self-pay

## 2023-05-23 VITALS — Ht 67.0 in | Wt 210.0 lb

## 2023-05-23 DIAGNOSIS — R911 Solitary pulmonary nodule: Secondary | ICD-10-CM | POA: Diagnosis not present

## 2023-05-23 DIAGNOSIS — J849 Interstitial pulmonary disease, unspecified: Secondary | ICD-10-CM

## 2023-05-23 NOTE — Patient Instructions (Addendum)
  ILD /ILA - is very mild and stable over time  on CT June 2024 compared to may 2023  Plan -DO blood work  next few to several days -  ESR, ANA, DS-DNA, RF,  - Do quantiferon gold blood work  - do cbc, bmet, lft  -We will discuss in a case conference- EMAIL sent to ILD cordinator  - Do spirometry and dlco in 6 months   Lung nodule   - stable  2cm in Right middle lobe x 1 year  Plan  - Followup CT chest in 1 year wthout contrast  ROV  - 6 moths face to face visit with Kurt Lloyd to discuss

## 2023-05-23 NOTE — Progress Notes (Signed)
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 kg/m as calculated from the following:   Height as of this encounter: 5\' 7"  (1.702 m).   Weight as of this encounter: 210 lb (95.3 kg).  @WEIGHTCHANGE @  [employer]   05/23/23 1051  Weight: 210 lb (95.3 kg)     Physical Exam   General: No distress. obese O2 at rest: no Cane present: no Sitting in wheel chair: no Frail: no Obese: YES Neuro: Alert and Oriented x 3. GCS 15. Speech normal Psych: Pleasant       Assessment:       ICD-10-CM   1. ILD (interstitial lung disease) (HCC)  J84.9 Sed Rate (ESR)    ANA+ENA+DNA/DS+Scl 70+SjoSSA/B    Rheumatoid Factor    Cyclic citrul peptide antibody, IgG    CK (Creatine Kinase)    Aldolase    RNP Antibodies    Anti-Smith antibody    Hypersensitivity Pneumonitis    QuantiFERON-TB Gold  Plus    CBC w/Diff    Basic Metabolic Panel (BMET)    Hepatic function panel    Pulmonary function test    2. Lung nodule  R91.1 CT Chest Wo Contrast         Plan:     Patient Instructions   ILD Mindi Slicker - is very mild and stable over time  on CT June 2024 compared to may 2023  Plan -DO blood work  next few to several days -  ESR, ANA, DS-DNA, RF,  - Do quantiferon gold blood work  - do cbc, bmet, lft  -We will discuss in a case conference- EMAIL sent to ILD cordinator  - Do spirometry and dlco in 6 months   Lung nodule   - stable  2cm in Right middle lobe x 1 year  Plan  - Followup CT chest in 1 year wthout contrast       - 6 moths face to face visit with Dr Marchelle Gearing to discuss     SIGNATURE    Dr. Kalman Shan, M.D., F.C.C.P,  Pulmonary and Critical Care Medicine Staff Physician, [facility] Center Director - Interstitial Lung Disease  Program  Pulmonary Fibrosis [facility] Network at [facility] [place], [place], [address]  Pager: [phone], If no answer or between  15:00h - 7:00h: call [phone] Telephone: [phone]  1:12 PM 05/23/2023   Moderate Complexity MDM OFFICE  2021 E/M guidelines, first released in 2021, with minor revisions added in 2023 and 2024 Must meet the requirements for 2 out of 3 dimensions to qualify.    Number and complexity of problems addressed Amount and/or complexity of data reviewed Risk of complications and/or morbidity  One or more chronic illness with mild exacerbation, OR progression, OR  side effects of treatment  Two or more stable chronic illnesses - nodule an dILD  One undiagnosed new problem with uncertain prognosis  One acute illness with systemic symptoms   One Acute complicated injury Must meet the requirements for 1 of 3 of the categories)  Category 1: Tests and documents, historian  Any combination of 3 of the following:  Assessment requiring an independent  historian -wife asking quso  Review of prior external note(s) from each unique source -  dr Candise Che Jan 2024  Review of results of each unique test - CT  Ordering of each unique test PFT    Category 2: Interpretation of tests   Independent interpretation of a test performed by another physician/other qualified health care professional (not separately reported) - CT reports  Category 3: Discuss management/tests  Discussion of management or test interpretation with external physician/other qualified health care professional/appropriate source (not separately reported) - pso Moderate risk of morbidity from additional diagnostic testing or treatment Examples only:  Prescription drug management  Decision regarding minor surgery with identfied patient or procedure risk factors  Decision regarding elective major surgery without identified patient or procedure risk factors  Diagnosis or treatment significantly limited by social determinants of health

## 2023-05-23 NOTE — Telephone Encounter (Signed)
Called [facility] and the receptionist said she will call radiology to have one of the radiologist to read the imaging today before 10AM but promises it will be read by then.

## 2023-05-28 DIAGNOSIS — J301 Allergic rhinitis due to pollen: Secondary | ICD-10-CM | POA: Diagnosis not present

## 2023-05-28 DIAGNOSIS — J3081 Allergic rhinitis due to animal (cat) (dog) hair and dander: Secondary | ICD-10-CM | POA: Diagnosis not present

## 2023-05-28 DIAGNOSIS — J3089 Other allergic rhinitis: Secondary | ICD-10-CM | POA: Diagnosis not present

## 2023-06-03 DIAGNOSIS — J301 Allergic rhinitis due to pollen: Secondary | ICD-10-CM | POA: Diagnosis not present

## 2023-06-03 DIAGNOSIS — J3081 Allergic rhinitis due to animal (cat) (dog) hair and dander: Secondary | ICD-10-CM | POA: Diagnosis not present

## 2023-06-03 DIAGNOSIS — J3089 Other allergic rhinitis: Secondary | ICD-10-CM | POA: Diagnosis not present

## 2023-06-18 ENCOUNTER — Ambulatory Visit: Payer: Self-pay | Admitting: Internal Medicine
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
## 2023-06-20 ENCOUNTER — Other Ambulatory Visit: Payer: Self-pay

## 2023-06-21 ENCOUNTER — Inpatient Hospital Stay: Payer: Federal, State, Local not specified - PPO | Admitting: Hematology

## 2023-06-21 ENCOUNTER — Inpatient Hospital Stay: Payer: Federal, State, Local not specified - PPO | Attending: Internal Medicine

## 2023-06-24 ENCOUNTER — Telehealth: Payer: Self-pay | Admitting: Hematology

## 2023-06-24 DIAGNOSIS — J3089 Other allergic rhinitis: Secondary | ICD-10-CM | POA: Diagnosis not present

## 2023-06-24 DIAGNOSIS — J301 Allergic rhinitis due to pollen: Secondary | ICD-10-CM | POA: Diagnosis not present

## 2023-06-24 DIAGNOSIS — J3081 Allergic rhinitis due to animal (cat) (dog) hair and dander: Secondary | ICD-10-CM | POA: Diagnosis not present

## 2023-06-24 NOTE — Telephone Encounter (Signed)
Patient is aware of upcoming appointment times/dates.  

## 2023-07-08 DIAGNOSIS — L814 Other melanin hyperpigmentation: Secondary | ICD-10-CM | POA: Diagnosis not present

## 2023-07-08 DIAGNOSIS — C4442 Squamous cell carcinoma of skin of scalp and neck: Secondary | ICD-10-CM | POA: Diagnosis not present

## 2023-07-08 DIAGNOSIS — L570 Actinic keratosis: Secondary | ICD-10-CM | POA: Diagnosis not present

## 2023-07-08 DIAGNOSIS — D492 Neoplasm of unspecified behavior of bone, soft tissue, and skin: Secondary | ICD-10-CM | POA: Diagnosis not present

## 2023-07-08 DIAGNOSIS — L820 Inflamed seborrheic keratosis: Secondary | ICD-10-CM | POA: Diagnosis not present

## 2023-07-08 DIAGNOSIS — D225 Melanocytic nevi of trunk: Secondary | ICD-10-CM | POA: Diagnosis not present

## 2023-07-08 DIAGNOSIS — L578 Other skin changes due to chronic exposure to nonionizing radiation: Secondary | ICD-10-CM | POA: Diagnosis not present

## 2023-07-08 DIAGNOSIS — J301 Allergic rhinitis due to pollen: Secondary | ICD-10-CM | POA: Diagnosis not present

## 2023-07-08 DIAGNOSIS — J3081 Allergic rhinitis due to animal (cat) (dog) hair and dander: Secondary | ICD-10-CM | POA: Diagnosis not present

## 2023-07-08 DIAGNOSIS — J3089 Other allergic rhinitis: Secondary | ICD-10-CM | POA: Diagnosis not present

## 2023-07-08 DIAGNOSIS — L821 Other seborrheic keratosis: Secondary | ICD-10-CM | POA: Diagnosis not present

## 2023-07-08 DIAGNOSIS — L298 Other pruritus: Secondary | ICD-10-CM | POA: Diagnosis not present

## 2023-07-15 DIAGNOSIS — J301 Allergic rhinitis due to pollen: Secondary | ICD-10-CM | POA: Diagnosis not present

## 2023-07-15 DIAGNOSIS — J3089 Other allergic rhinitis: Secondary | ICD-10-CM | POA: Diagnosis not present

## 2023-07-15 DIAGNOSIS — J3081 Allergic rhinitis due to animal (cat) (dog) hair and dander: Secondary | ICD-10-CM | POA: Diagnosis not present

## 2023-07-17 DIAGNOSIS — C4442 Squamous cell carcinoma of skin of scalp and neck: Secondary | ICD-10-CM | POA: Diagnosis not present

## 2023-08-09 DIAGNOSIS — J3081 Allergic rhinitis due to animal (cat) (dog) hair and dander: Secondary | ICD-10-CM | POA: Diagnosis not present

## 2023-08-09 DIAGNOSIS — J3089 Other allergic rhinitis: Secondary | ICD-10-CM | POA: Diagnosis not present

## 2023-08-09 DIAGNOSIS — J301 Allergic rhinitis due to pollen: Secondary | ICD-10-CM | POA: Diagnosis not present

## 2023-08-21 DIAGNOSIS — J3089 Other allergic rhinitis: Secondary | ICD-10-CM | POA: Diagnosis not present

## 2023-08-21 DIAGNOSIS — J301 Allergic rhinitis due to pollen: Secondary | ICD-10-CM | POA: Diagnosis not present

## 2023-08-21 DIAGNOSIS — J3081 Allergic rhinitis due to animal (cat) (dog) hair and dander: Secondary | ICD-10-CM | POA: Diagnosis not present

## 2023-08-23 DIAGNOSIS — N1831 Chronic kidney disease, stage 3a: Secondary | ICD-10-CM | POA: Diagnosis not present

## 2023-08-23 DIAGNOSIS — E1122 Type 2 diabetes mellitus with diabetic chronic kidney disease: Secondary | ICD-10-CM | POA: Diagnosis not present

## 2023-08-23 DIAGNOSIS — I129 Hypertensive chronic kidney disease with stage 1 through stage 4 chronic kidney disease, or unspecified chronic kidney disease: Secondary | ICD-10-CM | POA: Diagnosis not present

## 2023-08-23 DIAGNOSIS — E785 Hyperlipidemia, unspecified: Secondary | ICD-10-CM | POA: Diagnosis not present

## 2023-08-26 ENCOUNTER — Inpatient Hospital Stay: Payer: Federal, State, Local not specified - PPO

## 2023-08-26 ENCOUNTER — Inpatient Hospital Stay: Payer: Federal, State, Local not specified - PPO | Attending: Internal Medicine | Admitting: Hematology

## 2023-08-26 DIAGNOSIS — Q211 Atrial septal defect, unspecified: Secondary | ICD-10-CM | POA: Diagnosis not present

## 2023-08-26 DIAGNOSIS — R944 Abnormal results of kidney function studies: Secondary | ICD-10-CM | POA: Diagnosis not present

## 2023-08-26 DIAGNOSIS — Z79899 Other long term (current) drug therapy: Secondary | ICD-10-CM | POA: Diagnosis not present

## 2023-08-26 DIAGNOSIS — N189 Chronic kidney disease, unspecified: Secondary | ICD-10-CM | POA: Insufficient documentation

## 2023-08-26 DIAGNOSIS — Z7982 Long term (current) use of aspirin: Secondary | ICD-10-CM | POA: Diagnosis not present

## 2023-08-26 DIAGNOSIS — E119 Type 2 diabetes mellitus without complications: Secondary | ICD-10-CM | POA: Insufficient documentation

## 2023-08-26 DIAGNOSIS — Z794 Long term (current) use of insulin: Secondary | ICD-10-CM | POA: Insufficient documentation

## 2023-08-26 DIAGNOSIS — R7989 Other specified abnormal findings of blood chemistry: Secondary | ICD-10-CM | POA: Diagnosis not present

## 2023-08-26 DIAGNOSIS — K429 Umbilical hernia without obstruction or gangrene: Secondary | ICD-10-CM | POA: Diagnosis not present

## 2023-08-26 DIAGNOSIS — Z9081 Acquired absence of spleen: Secondary | ICD-10-CM | POA: Diagnosis not present

## 2023-08-26 LAB — CBC WITH DIFFERENTIAL (CANCER CENTER ONLY)
Abs Immature Granulocytes: 0.02 10*3/uL (ref 0.00–0.07)
Basophils Absolute: 0.2 10*3/uL — ABNORMAL HIGH (ref 0.0–0.1)
Basophils Relative: 2 %
Eosinophils Absolute: 0.4 10*3/uL (ref 0.0–0.5)
Eosinophils Relative: 5 %
HCT: 37.3 % — ABNORMAL LOW (ref 39.0–52.0)
Hemoglobin: 13.6 g/dL (ref 13.0–17.0)
Immature Granulocytes: 0 %
Lymphocytes Relative: 31 %
Lymphs Abs: 2.3 10*3/uL (ref 0.7–4.0)
MCH: 37 pg — ABNORMAL HIGH (ref 26.0–34.0)
MCHC: 36.5 g/dL — ABNORMAL HIGH (ref 30.0–36.0)
MCV: 101.4 fL — ABNORMAL HIGH (ref 80.0–100.0)
Monocytes Absolute: 0.8 10*3/uL (ref 0.1–1.0)
Monocytes Relative: 10 %
Neutro Abs: 3.9 10*3/uL (ref 1.7–7.7)
Neutrophils Relative %: 52 %
Platelet Count: 298 10*3/uL (ref 150–400)
RBC: 3.68 MIL/uL — ABNORMAL LOW (ref 4.22–5.81)
RDW: 13.8 % (ref 11.5–15.5)
WBC Count: 7.5 10*3/uL (ref 4.0–10.5)
nRBC: 0 % (ref 0.0–0.2)

## 2023-08-26 LAB — CMP (CANCER CENTER ONLY)
ALT: 18 U/L (ref 0–44)
AST: 24 U/L (ref 15–41)
Albumin: 3.9 g/dL (ref 3.5–5.0)
Alkaline Phosphatase: 76 U/L (ref 38–126)
Anion gap: 6 (ref 5–15)
BUN: 24 mg/dL — ABNORMAL HIGH (ref 8–23)
CO2: 25 mmol/L (ref 22–32)
Calcium: 8.4 mg/dL — ABNORMAL LOW (ref 8.9–10.3)
Chloride: 106 mmol/L (ref 98–111)
Creatinine: 1.77 mg/dL — ABNORMAL HIGH (ref 0.61–1.24)
GFR, Estimated: 42 mL/min — ABNORMAL LOW (ref >60)
Glucose, Bld: 213 mg/dL — ABNORMAL HIGH (ref 70–99)
Potassium: 4.4 mmol/L (ref 3.5–5.1)
Sodium: 137 mmol/L (ref 135–145)
Total Bilirubin: 1.1 mg/dL (ref 0.3–1.2)
Total Protein: 7.3 g/dL (ref 6.5–8.1)

## 2023-08-26 LAB — IRON AND IRON BINDING CAPACITY (CC-WL,HP ONLY)
Iron: 214 ug/dL — ABNORMAL HIGH (ref 45–182)
Saturation Ratios: 74 % — ABNORMAL HIGH (ref 17.9–39.5)
TIBC: 291 ug/dL (ref 250–450)
UIBC: 77 ug/dL — ABNORMAL LOW (ref 117–376)

## 2023-08-26 LAB — FERRITIN: Ferritin: 71 ng/mL (ref 24–336)
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
## 2023-08-28 DIAGNOSIS — J3089 Other allergic rhinitis: Secondary | ICD-10-CM | POA: Diagnosis not present

## 2023-08-28 DIAGNOSIS — J301 Allergic rhinitis due to pollen: Secondary | ICD-10-CM | POA: Diagnosis not present

## 2023-08-28 DIAGNOSIS — J3081 Allergic rhinitis due to animal (cat) (dog) hair and dander: Secondary | ICD-10-CM | POA: Diagnosis not present

## 2023-09-01 ENCOUNTER — Encounter: Payer: Self-pay | Admitting: Hematology

## 2023-09-03 ENCOUNTER — Encounter: Payer: Self-pay | Admitting: Nephrology

## 2023-09-06 DIAGNOSIS — J3081 Allergic rhinitis due to animal (cat) (dog) hair and dander: Secondary | ICD-10-CM | POA: Diagnosis not present

## 2023-09-06 DIAGNOSIS — J301 Allergic rhinitis due to pollen: Secondary | ICD-10-CM | POA: Diagnosis not present

## 2023-09-06 DIAGNOSIS — J3089 Other allergic rhinitis: Secondary | ICD-10-CM | POA: Diagnosis not present

## 2023-09-25 DIAGNOSIS — D62 Acute posthemorrhagic anemia: Secondary | ICD-10-CM | POA: Diagnosis not present

## 2023-09-25 DIAGNOSIS — E785 Hyperlipidemia, unspecified: Secondary | ICD-10-CM | POA: Diagnosis not present

## 2023-09-25 DIAGNOSIS — E1129 Type 2 diabetes mellitus with other diabetic kidney complication: Secondary | ICD-10-CM | POA: Diagnosis not present

## 2023-09-27 DIAGNOSIS — J301 Allergic rhinitis due to pollen: Secondary | ICD-10-CM | POA: Diagnosis not present

## 2023-09-27 DIAGNOSIS — J3089 Other allergic rhinitis: Secondary | ICD-10-CM | POA: Diagnosis not present

## 2023-09-27 DIAGNOSIS — J3081 Allergic rhinitis due to animal (cat) (dog) hair and dander: Secondary | ICD-10-CM | POA: Diagnosis not present

## 2023-09-30 DIAGNOSIS — Z1339 Encounter for screening examination for other mental health and behavioral disorders: Secondary | ICD-10-CM | POA: Diagnosis not present

## 2023-09-30 DIAGNOSIS — I10 Essential (primary) hypertension: Secondary | ICD-10-CM | POA: Diagnosis not present

## 2023-09-30 DIAGNOSIS — E669 Obesity, unspecified: Secondary | ICD-10-CM | POA: Diagnosis not present

## 2023-09-30 DIAGNOSIS — E1129 Type 2 diabetes mellitus with other diabetic kidney complication: Secondary | ICD-10-CM | POA: Diagnosis not present

## 2023-09-30 DIAGNOSIS — Z0000 Encounter for general adult medical examination without abnormal findings: Secondary | ICD-10-CM | POA: Diagnosis not present

## 2023-09-30 DIAGNOSIS — R82998 Other abnormal findings in urine: Secondary | ICD-10-CM | POA: Diagnosis not present

## 2023-09-30 DIAGNOSIS — Z1331 Encounter for screening for depression: Secondary | ICD-10-CM | POA: Diagnosis not present

## 2023-10-02 DIAGNOSIS — J3081 Allergic rhinitis due to animal (cat) (dog) hair and dander: Secondary | ICD-10-CM | POA: Diagnosis not present

## 2023-10-02 DIAGNOSIS — J301 Allergic rhinitis due to pollen: Secondary | ICD-10-CM | POA: Diagnosis not present

## 2023-10-02 DIAGNOSIS — J3089 Other allergic rhinitis: Secondary | ICD-10-CM | POA: Diagnosis not present

## 2023-10-09 DIAGNOSIS — J3081 Allergic rhinitis due to animal (cat) (dog) hair and dander: Secondary | ICD-10-CM | POA: Diagnosis not present

## 2023-10-09 DIAGNOSIS — J3089 Other allergic rhinitis: Secondary | ICD-10-CM | POA: Diagnosis not present

## 2023-10-09 DIAGNOSIS — J301 Allergic rhinitis due to pollen: Secondary | ICD-10-CM | POA: Diagnosis not present

## 2023-10-11 ENCOUNTER — Telehealth: Payer: Self-pay

## 2023-10-11 ENCOUNTER — Other Ambulatory Visit: Payer: Self-pay

## 2023-10-11 DIAGNOSIS — R932 Abnormal findings on diagnostic imaging of liver and biliary tract: Secondary | ICD-10-CM

## 2023-10-11 NOTE — Telephone Encounter (Signed)
Pt made aware of reminder received in Epic.  Pt was ordered and scheduled for the MRI on 11/04/2023 at 12:00 PM at [facility].  Pt to arrive at 11:30 AM. Nothing to eat or drink prior. Pt verbalized understanding with all questions answered.

## 2023-10-11 NOTE — Telephone Encounter (Signed)
-----   Message from Nurse Joselyn Glassman sent at 11/30/2022 10:40 AM EST ----- Personal reminder placed on 12/01/2023  Please place a reminder to have a liver MR (abdominal) again in 1 year (10/2023 reminder)

## 2023-10-21 DIAGNOSIS — J3089 Other allergic rhinitis: Secondary | ICD-10-CM | POA: Diagnosis not present

## 2023-10-21 DIAGNOSIS — J301 Allergic rhinitis due to pollen: Secondary | ICD-10-CM | POA: Diagnosis not present

## 2023-10-21 DIAGNOSIS — J3081 Allergic rhinitis due to animal (cat) (dog) hair and dander: Secondary | ICD-10-CM | POA: Diagnosis not present

## 2023-10-23 DIAGNOSIS — J3089 Other allergic rhinitis: Secondary | ICD-10-CM | POA: Diagnosis not present

## 2023-10-23 DIAGNOSIS — J301 Allergic rhinitis due to pollen: Secondary | ICD-10-CM | POA: Diagnosis not present

## 2023-10-23 DIAGNOSIS — J3081 Allergic rhinitis due to animal (cat) (dog) hair and dander: Secondary | ICD-10-CM | POA: Diagnosis not present

## 2023-10-31 DIAGNOSIS — J3089 Other allergic rhinitis: Secondary | ICD-10-CM | POA: Diagnosis not present

## 2023-10-31 DIAGNOSIS — J301 Allergic rhinitis due to pollen: Secondary | ICD-10-CM | POA: Diagnosis not present

## 2023-10-31 DIAGNOSIS — J3081 Allergic rhinitis due to animal (cat) (dog) hair and dander: Secondary | ICD-10-CM | POA: Diagnosis not present

## 2023-11-04 ENCOUNTER — Ambulatory Visit (HOSPITAL_COMMUNITY)
Admission: RE | Admit: 2023-11-04 | Discharge: 2023-11-04 | Disposition: A | Discharge status: Home / Self Care | Payer: Federal, State, Local not specified - PPO | Source: Ambulatory Visit | Attending: Internal Medicine | Admitting: Internal Medicine

## 2023-11-04 DIAGNOSIS — R932 Abnormal findings on diagnostic imaging of liver and biliary tract: Secondary | ICD-10-CM | POA: Diagnosis not present

## 2023-11-04 DIAGNOSIS — I700 Atherosclerosis of aorta: Secondary | ICD-10-CM | POA: Diagnosis not present

## 2023-11-04 DIAGNOSIS — K8020 Calculus of gallbladder without cholecystitis without obstruction: Secondary | ICD-10-CM | POA: Diagnosis not present

## 2023-11-04 MED ORDER — GADOBUTROL 1 MMOL/ML IV SOLN
10.0000 mL | Freq: Once | INTRAVENOUS | Status: AC | PRN
  Administered 2023-11-04: 10 mL via INTRAVENOUS

## 2023-11-05 DIAGNOSIS — R051 Acute cough: Secondary | ICD-10-CM | POA: Diagnosis not present

## 2023-11-05 DIAGNOSIS — E1129 Type 2 diabetes mellitus with other diabetic kidney complication: Secondary | ICD-10-CM | POA: Diagnosis not present

## 2023-11-05 DIAGNOSIS — R0981 Nasal congestion: Secondary | ICD-10-CM | POA: Diagnosis not present

## 2023-11-14 ENCOUNTER — Encounter: Payer: Self-pay | Admitting: Internal Medicine

## 2023-11-14 ENCOUNTER — Ambulatory Visit: Payer: Federal, State, Local not specified - PPO | Admitting: Internal Medicine

## 2023-11-14 VITALS — BP 123/81 | HR 52 | Ht 67.0 in | Wt 209.0 lb

## 2023-11-14 DIAGNOSIS — J301 Allergic rhinitis due to pollen: Secondary | ICD-10-CM | POA: Diagnosis not present

## 2023-11-14 DIAGNOSIS — R911 Solitary pulmonary nodule: Secondary | ICD-10-CM | POA: Diagnosis not present

## 2023-11-14 DIAGNOSIS — J3081 Allergic rhinitis due to animal (cat) (dog) hair and dander: Secondary | ICD-10-CM | POA: Diagnosis not present

## 2023-11-14 DIAGNOSIS — J849 Interstitial pulmonary disease, unspecified: Secondary | ICD-10-CM | POA: Diagnosis not present

## 2023-11-14 DIAGNOSIS — J3089 Other allergic rhinitis: Secondary | ICD-10-CM | POA: Diagnosis not present

## 2023-11-14 NOTE — Patient Instructions (Signed)
Spirometry/DLCO performed today. 

## 2023-11-14 NOTE — Progress Notes (Signed)
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 10/17/2020, 10/18/2021   Influenza, Quadrivalent, Recombinant, Inj, Pf 08/16/2018, 09/12/2019, 09/12/2020, 08/10/2021   Influenza,inj,Quad PF,6+ Mos 09/07/2022, 10/22/2022   Influenza,inj,Quad PF,6-35 Mos 09/10/2019   Influenza-Unspecified 01/03/2017, 10/20/2018, 11/11/2018, 10/17/2020, 10/11/2023   Moderna Sars-Covid-2 Vaccination 02/09/2020, 03/04/2020, 03/31/2020, 10/17/2020, 03/18/2021   Pneumococcal Conjugate-13 03/20/2013   Pneumococcal Polysaccharide-23 07/04/2010, 03/20/2013, 01/03/2017   Td (Adult),5 Lf Tetanus Toxid, Preservative Free 06/20/2010,  09/28/2011   Tdap 08/10/2021   Zoster Recombinant(Shingrix) 05/25/2017, 11/04/2017   Zoster, Live 05/25/2017    Family History  Problem Relation Age of Onset   Stroke Mother    Lung cancer Father    Hypertension Father    Crohn's disease Daughter    Colon cancer Neg Hx    Esophageal cancer Neg Hx    Rectal cancer Neg Hx    Stomach cancer Neg Hx      Current Outpatient Medications:    acetaminophen (TYLENOL) 500 MG tablet, Take 1,000 mg by mouth every 6 (six) hours as needed for moderate pain or headache., Disp: , Rfl:    aspirin EC 81 MG tablet, Take 1 tablet (81 mg total) by mouth daily. Swallow whole., Disp: 90 tablet, Rfl: 3   Cholecalciferol (VITAMIN D3) 50 MCG (2000 UT) TABS, Take 2,000 Units by mouth daily., Disp: , Rfl:    Dapagliflozin Propanediol (FARXIGA PO), Take by mouth., Disp: , Rfl:    EPINEPHrine 0.3 mg/0.3 mL IJ SOAJ injection, Inject 0.3 mg into the muscle as needed for anaphylaxis (allergy shot). , Disp: , Rfl:    fluticasone (FLONASE) 50 MCG/ACT nasal spray, Place 1 spray into both nostrils daily., Disp: , Rfl:    Fluticasone-Sodium Chloride (DERMACINRX TICANASE PAK) 50-2.7 MCG/ACT-% THPK, Place into the nose., Disp: , Rfl:    folic  acid-vitamin b complex-vitamin c-selenium-zinc (DIALYVITE) 3 MG TABS tablet, Take 1 tablet by mouth daily., Disp: , Rfl:    Insulin Glargine (BASAGLAR KWIKPEN) 100 UNIT/ML, SMARTSIG:10 Unit(s) SUB-Q Daily, Disp: , Rfl:    levothyroxine (SYNTHROID) 50 MCG tablet, Take 50 mcg by mouth every morning., Disp: , Rfl:    losartan (COZAAR) 25 MG tablet, Take 25 mg by mouth daily., Disp: , Rfl:    Olopatadine HCl 0.6 % SOLN, Place 2 sprays into the nose 2 (two) times daily., Disp: , Rfl:    OVER THE COUNTER MEDICATION, Inject 1 Dose as directed once a week. Allergy Shots, Disp: , Rfl:    pantoprazole sodium (PROTONIX) 40 mg/20 mL PACK, Place 20 mLs into feeding tube daily., Disp: , Rfl:    Polyethyl Glycol-Propyl Glycol (SYSTANE) 0.4-0.3 %  SOLN, Place 1 drop into both eyes 3 (three) times daily as needed (dry/irritated eyes.)., Disp: , Rfl:    rosuvastatin (CRESTOR) 40 MG tablet, Take 40 mg by mouth daily., Disp: , Rfl:    UNIFINE PENTIPS 31G X 8 MM MISC, , Disp: , Rfl:       Objective:   Vitals:   11/14/23 1300  BP: 123/81  Pulse: (!) 52  SpO2: 96%  Weight: 209 lb (94.8 kg)  Height: 5\' 7"  (1.702 m)    Estimated body mass index is 32.73 kg/m as calculated from the following:   Height as of this encounter: 5\' 7"  (1.702 m).   Weight as of this encounter: 209 lb (94.8 kg).  @WEIGHTCHANGE @  Filed Weights   11/14/23 1300  Weight: 209 lb (94.8 kg)     Physical Exam   General: No distress. Looks well O2 at rest: no Cane present: no Sitting in wheel chair: no Frail: no Obese: no Neuro: Alert and Oriented x 3. GCS 15. Speech normal Psych: Pleasant Resp:  Barrel Chest - no.  Wheeze - no, Crackles - no, No overt respiratory distress CVS: Normal heart sounds. Murmurs - no Ext: Stigmata of Connective Tissue Disease - no HEENT: Normal upper airway. PEERL +. No post nasal drip        Assessment:       ICD-10-CM   1. Lung nodule  R91.1 CT Chest High Resolution    2. ILD (interstitial lung disease) (HCC)  J84.9 Pulmonary function test         Plan:     Patient Instructions   ILD Mindi Slicker - is very mild and stable over time  on CT June 2024 compared to may 2023  - case confernce July 2024 also agreed findings are probably non speciicif cand recommended monitoring - currently 11/14/2023 - you are stable  Plan - Do spirometry and dlco in7  months; June 2025 -Do your 1 year high-resolution CT scan of the chest in June 2025   Lung nodule June 2024  - stable  2cm in Right middle lobe x 1 year  Plan  - Followup CT chest in June 2025; above high-resolution CT chest we will capture the information  ROV  - 7 moths face to face visit with Dr Marchelle Gearing to discuss    FOLLOWUP Return in about 7 months  (around 06/13/2024) for 15 min visit, after Cleda Daub and DLCO, ILD, after HRCT chest, with Dr Marchelle Gearing.    SIGNATURE    Dr. Kalman Shan, M.D., F.C.C.P,  Pulmonary and Critical Care Medicine Staff Physician, [facility] Center Director - Interstitial Lung Disease  Program  [facility] [place], [place], [address]  Pager: [phone], If no answer or between  15:00h - 7:00h: call [phone] Telephone: [phone]  1:18 PM 11/14/2023   Moderate Complexity MDM OFFICE  2021 E/M guidelines, first released in 2021, with minor revisions added in 2023 and 2024 Must meet the requirements for 2 out of 3 dimensions to qualify.    Number and complexity of problems addressed Amount and/or complexity of data reviewed Risk of complications and/or morbidity  One or more chronic illness with mild exacerbation, OR progression, OR  side effects of treatment  Two or more stable chronic illnesses  One undiagnosed new problem with uncertain prognosis  One acute illness with systemic symptoms   One Acute complicated injury Must meet the requirements for 1 of 3 of the categories)  Category 1: Tests and documents, historian  Any combination of 3 of the following:  Assessment requiring an independent historian  Review of prior external note(s) from each unique source  Review of results of each unique test  Ordering of each unique test    Category 2: Interpretation of tests   Independent interpretation of a test performed by another physician/other qualified health care professional (not separately reported)  Category 3: Discuss management/tests  Discussion of management or test interpretation with external physician/other qualified health care professional/appropriate source (not separately reported) Moderate risk of morbidity from additional diagnostic testing or treatment Examples only:  Prescription drug  management  Decision regarding minor surgery with identfied patient or procedure risk factors  Decision regarding elective major surgery without identified patient or procedure risk factors  Diagnosis or treatment significantly limited by social determinants of health

## 2023-11-14 NOTE — Patient Instructions (Addendum)
  ILD /ILA - is very mild and stable over time  on CT June 2024 compared to may 2023  - case confernce July 2024 also agreed findings are probably non speciicif cand recommended monitoring - currently 11/14/2023 - you are stable  Plan - Do spirometry and dlco in7  months; June 2025 -Do your 1 year high-resolution CT scan of the chest in June 2025   Lung nodule June 2024  - stable  2cm in Right middle lobe x 1 year  Plan  - Followup CT chest in June 2025; above high-resolution CT chest we will capture the information  ROV  - 7 moths face to face visit with Dr Marchelle Gearing to discuss

## 2023-11-14 NOTE — Progress Notes (Signed)
Spirometry/DLCO performed today. 

## 2023-11-19 LAB — PULMONARY FUNCTION TEST
DL/VA % pred: 68 %
DL/VA: 2.9 ml/min/mmHg/L
DLCO cor % pred: 62 %
DLCO cor: 15.4 ml/min/mmHg
DLCO unc % pred: 62 %
DLCO unc: 15.4 ml/min/mmHg
FEF 25-75 Pre: 2.69 L/s
FEF2575-%Pred-Pre: 107 %
FEV1-%Pred-Pre: 94 %
FEV1-Pre: 2.94 L
FEV1FVC-%Pred-Pre: 104 %
FEV6-%Pred-Pre: 95 %
FEV6-Pre: 3.74 L
FEV6FVC-%Pred-Pre: 105 %
FVC-%Pred-Pre: 90 %
FVC-Pre: 3.76 L
Pre FEV1/FVC ratio: 78 %
Pre FEV6/FVC Ratio: 100 %

## 2023-11-28 DIAGNOSIS — J3081 Allergic rhinitis due to animal (cat) (dog) hair and dander: Secondary | ICD-10-CM | POA: Diagnosis not present

## 2023-11-28 DIAGNOSIS — J301 Allergic rhinitis due to pollen: Secondary | ICD-10-CM | POA: Diagnosis not present

## 2023-11-28 DIAGNOSIS — J3089 Other allergic rhinitis: Secondary | ICD-10-CM | POA: Diagnosis not present

## 2023-12-02 DIAGNOSIS — B349 Viral infection, unspecified: Secondary | ICD-10-CM | POA: Diagnosis not present

## 2023-12-02 DIAGNOSIS — R0981 Nasal congestion: Secondary | ICD-10-CM | POA: Diagnosis not present

## 2024-02-24 ENCOUNTER — Encounter: Payer: Self-pay | Admitting: Nephrology

## 2024-04-03 ENCOUNTER — Other Ambulatory Visit (HOSPITAL_COMMUNITY): Payer: Self-pay | Admitting: Family Medicine

## 2024-04-03 DIAGNOSIS — Z006 Encounter for examination for normal comparison and control in clinical research program: Secondary | ICD-10-CM

## 2024-05-14 ENCOUNTER — Other Ambulatory Visit: Payer: Federal, State, Local not specified - PPO

## 2024-05-21 ENCOUNTER — Ambulatory Visit (HOSPITAL_BASED_OUTPATIENT_CLINIC_OR_DEPARTMENT_OTHER)

## 2024-06-09 ENCOUNTER — Encounter: Payer: Self-pay | Admitting: Hematology

## 2024-06-10 ENCOUNTER — Other Ambulatory Visit: Payer: Self-pay

## 2024-06-10 ENCOUNTER — Encounter: Payer: Self-pay | Admitting: Hematology

## 2024-07-14 ENCOUNTER — Other Ambulatory Visit

## 2024-07-16 ENCOUNTER — Ambulatory Visit
Admission: RE | Admit: 2024-07-16 | Discharge: 2024-07-16 | Disposition: A | Discharge status: Home / Self Care | Source: Ambulatory Visit | Attending: Internal Medicine | Admitting: Internal Medicine

## 2024-07-16 ENCOUNTER — Encounter: Payer: Self-pay | Admitting: Hematology

## 2024-07-16 DIAGNOSIS — R911 Solitary pulmonary nodule: Secondary | ICD-10-CM

## 2024-08-31 ENCOUNTER — Inpatient Hospital Stay: Payer: Federal, State, Local not specified - PPO

## 2024-08-31 ENCOUNTER — Inpatient Hospital Stay: Payer: Federal, State, Local not specified - PPO | Admitting: Hematology

## 2024-09-13 ENCOUNTER — Ambulatory Visit: Payer: Self-pay | Admitting: Internal Medicine

## 2024-09-13 NOTE — Progress Notes (Signed)
 Sorry for late releas on ct but Interstitial abnormalities are stable. Prior ndule resolved. There is coronary artery calcification, cirrhosis and gallstrones . Can discuss this at end of the year visit

## 2024-09-16 ENCOUNTER — Ambulatory Visit (HOSPITAL_COMMUNITY)
Admission: RE | Admit: 2024-09-16 | Discharge: 2024-09-16 | Disposition: A | Discharge status: Home / Self Care | Payer: Self-pay | Source: Ambulatory Visit | Attending: Family Medicine | Admitting: Family Medicine

## 2024-09-16 DIAGNOSIS — I10 Essential (primary) hypertension: Secondary | ICD-10-CM | POA: Insufficient documentation

## 2024-09-16 DIAGNOSIS — Z006 Encounter for examination for normal comparison and control in clinical research program: Secondary | ICD-10-CM

## 2024-09-24 LAB — ECHOCARDIOGRAM COMPLETE
AR max vel: 2.56 cm2
AV Area VTI: 2.76 cm2
AV Area mean vel: 2.76 cm2
AV Mean grad: 3 mmHg
AV Peak grad: 6.2 mmHg
Ao pk vel: 1.24 m/s
Area-P 1/2: 5.34 cm2
Calc EF: 66.8 %
S' Lateral: 3.3 cm
Single Plane A2C EF: 63.1 %
Single Plane A4C EF: 67.4 %

## 2024-10-06 ENCOUNTER — Other Ambulatory Visit: Payer: Self-pay

## 2024-10-07 ENCOUNTER — Inpatient Hospital Stay (HOSPITAL_BASED_OUTPATIENT_CLINIC_OR_DEPARTMENT_OTHER): Admitting: Hematology

## 2024-10-07 ENCOUNTER — Inpatient Hospital Stay: Attending: Hematology

## 2024-10-07 DIAGNOSIS — Z86711 Personal history of pulmonary embolism: Secondary | ICD-10-CM | POA: Diagnosis not present

## 2024-10-07 DIAGNOSIS — Z801 Family history of malignant neoplasm of trachea, bronchus and lung: Secondary | ICD-10-CM | POA: Diagnosis not present

## 2024-10-07 DIAGNOSIS — Z8673 Personal history of transient ischemic attack (TIA), and cerebral infarction without residual deficits: Secondary | ICD-10-CM | POA: Insufficient documentation

## 2024-10-07 DIAGNOSIS — E119 Type 2 diabetes mellitus without complications: Secondary | ICD-10-CM | POA: Insufficient documentation

## 2024-10-07 LAB — CMP (CANCER CENTER ONLY)
ALT: 13 U/L (ref 0–44)
AST: 20 U/L (ref 15–41)
Albumin: 4.2 g/dL (ref 3.5–5.0)
Alkaline Phosphatase: 78 U/L (ref 38–126)
Anion gap: 6 (ref 5–15)
BUN: 26 mg/dL — ABNORMAL HIGH (ref 8–23)
CO2: 26 mmol/L (ref 22–32)
Calcium: 9.2 mg/dL (ref 8.9–10.3)
Chloride: 104 mmol/L (ref 98–111)
Creatinine: 1.95 mg/dL — ABNORMAL HIGH (ref 0.61–1.24)
GFR, Estimated: 37 mL/min — ABNORMAL LOW (ref >60)
Glucose, Bld: 330 mg/dL — ABNORMAL HIGH (ref 70–99)
Potassium: 4.7 mmol/L (ref 3.5–5.1)
Sodium: 136 mmol/L (ref 135–145)
Total Bilirubin: 0.8 mg/dL (ref 0.0–1.2)
Total Protein: 7.8 g/dL (ref 6.5–8.1)

## 2024-10-07 LAB — CBC WITH DIFFERENTIAL (CANCER CENTER ONLY)
Abs Immature Granulocytes: 0.03 K/uL (ref 0.00–0.07)
Basophils Absolute: 0.2 K/uL — ABNORMAL HIGH (ref 0.0–0.1)
Basophils Relative: 2 %
Eosinophils Absolute: 0.4 K/uL (ref 0.0–0.5)
Eosinophils Relative: 4 %
HCT: 39.7 % (ref 39.0–52.0)
Hemoglobin: 14.8 g/dL (ref 13.0–17.0)
Immature Granulocytes: 0 %
Lymphocytes Relative: 33 %
Lymphs Abs: 3 K/uL (ref 0.7–4.0)
MCH: 36.6 pg — ABNORMAL HIGH (ref 26.0–34.0)
MCHC: 37.3 g/dL — ABNORMAL HIGH (ref 30.0–36.0)
MCV: 98.3 fL (ref 80.0–100.0)
Monocytes Absolute: 0.8 K/uL (ref 0.1–1.0)
Monocytes Relative: 9 %
Neutro Abs: 4.7 K/uL (ref 1.7–7.7)
Neutrophils Relative %: 52 %
Platelet Count: 284 K/uL (ref 150–400)
RBC: 4.04 MIL/uL — ABNORMAL LOW (ref 4.22–5.81)
RDW: 12.5 % (ref 11.5–15.5)
WBC Count: 9.1 K/uL (ref 4.0–10.5)
nRBC: 0 % (ref 0.0–0.2)

## 2024-10-07 LAB — FERRITIN: Ferritin: 242 ng/mL (ref 24–336)

## 2024-10-07 LAB — IRON AND IRON BINDING CAPACITY (CC-WL,HP ONLY)
Iron: 124 ug/dL (ref 45–182)
Saturation Ratios: 43 % — ABNORMAL HIGH (ref 17.9–39.5)
TIBC: 291 ug/dL (ref 250–450)
UIBC: 167 ug/dL (ref 117–376)
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
## 2024-10-13 ENCOUNTER — Ambulatory Visit (INDEPENDENT_AMBULATORY_CARE_PROVIDER_SITE_OTHER): Admitting: *Deleted

## 2024-10-13 DIAGNOSIS — J849 Interstitial pulmonary disease, unspecified: Secondary | ICD-10-CM

## 2024-10-13 LAB — PULMONARY FUNCTION TEST
DL/VA % pred: 67 %
DL/VA: 2.81 ml/min/mmHg/L
DLCO cor % pred: 60 %
DLCO cor: 14.79 ml/min/mmHg
DLCO unc % pred: 61 %
DLCO unc: 14.87 ml/min/mmHg
FEF 25-75 Pre: 2.65 L/s
FEF2575-%Pred-Pre: 108 %
FEV1-%Pred-Pre: 95 %
FEV1-Pre: 2.94 L
FEV1FVC-%Pred-Pre: 104 %
FEV6-%Pred-Pre: 96 %
FEV6-Pre: 3.73 L
FEV6FVC-%Pred-Pre: 104 %
FVC-%Pred-Pre: 91 %
FVC-Pre: 3.78 L
Pre FEV1/FVC ratio: 78 %
Pre FEV6/FVC Ratio: 99 %

## 2024-10-13 NOTE — Patient Instructions (Signed)
 Spirometry and diffusion capacity performed today.

## 2024-10-13 NOTE — Progress Notes (Signed)
 Spirometry and diffusion capacity performed today.

## 2024-10-14 ENCOUNTER — Ambulatory Visit (INDEPENDENT_AMBULATORY_CARE_PROVIDER_SITE_OTHER): Admitting: Internal Medicine

## 2024-10-14 ENCOUNTER — Encounter

## 2024-10-14 ENCOUNTER — Encounter: Payer: Self-pay | Admitting: Internal Medicine

## 2024-10-14 VITALS — BP 128/73 | HR 52 | Temp 98.1°F | Ht 67.0 in | Wt 209.0 lb

## 2024-10-14 DIAGNOSIS — J849 Interstitial pulmonary disease, unspecified: Secondary | ICD-10-CM

## 2024-10-14 DIAGNOSIS — R911 Solitary pulmonary nodule: Secondary | ICD-10-CM

## 2024-10-14 DIAGNOSIS — R918 Other nonspecific abnormal finding of lung field: Secondary | ICD-10-CM | POA: Diagnosis not present

## 2024-10-14 NOTE — Progress Notes (Signed)
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 Ref Rng & Units 10/13/2024    3:57 PM 11/14/2023   11:13 AM 04/16/2023   12:02 PM 09/02/2020    8:57 AM 03/02/2020    8:53 AM  PFT Results  FVC-Pre L 3.78  P 3.76  3.65  4.16  4.14   FVC-Predicted Pre % 91  P 90  88  91  90   Pre FEV1/FVC % % 78  P 78  82  81  79   FEV1-Pre L 2.94  P 2.94  3.00  3.35  3.28   FEV1-Predicted Pre % 95  P 94  96  97  95   DLCO uncorrected ml/min/mmHg 14.87  P 15.40  16.97  19.04  18.63   DLCO UNC% % 61  P 62  69  71  69   DLCO corrected ml/min/mmHg 14.79  P 15.40  16.97  19.31  18.28   DLCO COR %Predicted % 60  P 62  69  72  68   DLVA Predicted % 67  P 68  74  76  73     P Preliminary result       LAB RESULTS last 96 hours No results found.       has a past  medical history of Allergy, Blood transfusion, Diabetes mellitus, DVT (deep venous thrombosis) (HCC), GERD (gastroesophageal reflux disease), Heart murmur, Hereditary spherocytosis, adenomatous polyp of colon (11/29/2022), Hyperlipidemia, Hypertension, and Stroke (HCC).   reports that he has never smoked. He has never used smokeless tobacco.  Past Surgical History:  Procedure Laterality Date   COLONOSCOPY     SPLENECTOMY     SVT ABLATION N/A 06/19/2019   Procedure: SVT ABLATION;  Surgeon: Waddell Danelle ORN, MD;  Location: [facility];  Service: Cardiovascular;  Laterality: N/A;   UMBILICAL HERNIA REPAIR     VASECTOMY      Allergies  Allergen Reactions   Other Other (See Comments)    Grass,Shrubs,Trees, Dust, Pollen    No Known Allergies     Immunization History  Administered Date(s) Administered   Hepb-cpg 07/11/2021   INFLUENZA, HIGH DOSE SEASONAL PF 01/03/2017, 11/11/2018, 10/17/2020, 10/18/2021   Influenza Split 06/20/2010, 09/11/2011, 10/12/2013, 10/08/2014   Influenza, Quadrivalent, Recombinant, Inj, Pf 08/16/2018, 09/12/2019, 09/12/2020, 08/10/2021   Influenza,inj,Quad PF,6+ Mos 09/07/2022, 10/22/2022   Influenza,inj,Quad PF,6-35 Mos 09/10/2019   Influenza-Unspecified 01/03/2017, 10/20/2018, 11/11/2018, 10/17/2020, 10/11/2023   Moderna Sars-Covid-2 Vaccination 02/09/2020, 03/04/2020, 03/31/2020, 10/17/2020, 03/18/2021   Pneumococcal Conjugate-13 03/20/2013   Pneumococcal Polysaccharide-23 07/04/2010, 03/20/2013, 01/03/2017   Td (Adult),5 Lf Tetanus Toxid, Preservative Free 06/20/2010, 09/28/2011   Tdap 08/10/2021   Zoster Recombinant(Shingrix) 05/25/2017, 11/04/2017   Zoster, Live 05/25/2017    Family History  Problem Relation Age of Onset   Stroke Mother    Lung cancer Father    Hypertension Father    Crohn's disease Daughter    Colon cancer Neg Hx    Esophageal cancer Neg Hx    Rectal cancer Neg Hx    Stomach cancer Neg Hx      Current Outpatient  Medications:    acetaminophen  (TYLENOL ) 500 MG tablet, Take 1,000 mg by mouth every 6 (six) hours as needed for  moderate pain or headache., Disp: , Rfl:    aspirin  EC 81 MG tablet, Take 1 tablet (81 mg total) by mouth daily. Swallow whole., Disp: 90 tablet, Rfl: 3   Cholecalciferol (VITAMIN D3) 50 MCG (2000 UT) TABS, Take 2,000 Units by mouth daily., Disp: , Rfl:    Dapagliflozin Propanediol (FARXIGA PO), Take by mouth., Disp: , Rfl:    EPINEPHrine 0.3 mg/0.3 mL IJ SOAJ injection, Inject 0.3 mg into the muscle as needed for anaphylaxis (allergy shot). , Disp: , Rfl:    fluticasone  (FLONASE ) 50 MCG/ACT nasal spray, Place 1 spray into both nostrils daily., Disp: , Rfl:    Fluticasone -Sodium Chloride  (DERMACINRX TICANASE PAK) 50-2.7 MCG/ACT-% THPK, Place into the nose., Disp: , Rfl:    folic acid-vitamin b complex-vitamin c-selenium-zinc (DIALYVITE) 3 MG TABS tablet, Take 1 tablet by mouth daily., Disp: , Rfl:    insulin  aspart (NOVOLOG ) 100 UNIT/ML injection, Inject 4 Units into the skin 2 (two) times daily., Disp: , Rfl:    Insulin  Glargine (BASAGLAR KWIKPEN) 100 UNIT/ML, SMARTSIG:10 Unit(s) SUB-Q Daily, Disp: , Rfl:    levothyroxine (SYNTHROID) 50 MCG tablet, Take 50 mcg by mouth every morning., Disp: , Rfl:    losartan (COZAAR) 25 MG tablet, Take 25 mg by mouth daily., Disp: , Rfl:    Olopatadine HCl 0.6 % SOLN, Place 2 sprays into the nose 2 (two) times daily., Disp: , Rfl:    OVER THE COUNTER MEDICATION, Inject 1 Dose as directed once a week. Allergy Shots, Disp: , Rfl:    pantoprazole  sodium (PROTONIX ) 40 mg/20 mL PACK, Place 20 mLs into feeding tube daily., Disp: , Rfl:    Polyethyl Glycol-Propyl Glycol (SYSTANE) 0.4-0.3 % SOLN, Place 1 drop into both eyes 3 (three) times daily as needed (dry/irritated eyes.)., Disp: , Rfl:    rosuvastatin  (CRESTOR ) 40 MG tablet, Take 40 mg by mouth daily., Disp: , Rfl:    UNIFINE PENTIPS 31G X 8 MM MISC, , Disp: , Rfl:       Objective:   Vitals:    10/14/24 1121  BP: 128/73  Pulse: (!) 52  Temp: 98.1 F (36.7 C)  TempSrc: Oral  SpO2: 95%  Weight: 209 lb (94.8 kg)  Height: 5' 7 (1.702 m)    Estimated body mass index is 32.73 kg/m as calculated from the following:   Height as of this encounter: 5' 7 (1.702 m).   Weight as of this encounter: 209 lb (94.8 kg).  @WEIGHTCHANGE @  [employer]   10/14/24 1121  Weight: 209 lb (94.8 kg)     Physical Exam   General: No distress. Looks well O2 at rest: no Cane present: no Sitting in wheel chair: no Frail: no Obese: no Neuro: Alert and Oriented x 3. GCS 15. Speech normal Psych: Pleasant Resp:  Barrel Chest - n.  Wheeze - ono, Crackles - no, No overt respiratory distress CVS: Normal heart sounds. Murmurs - no Ext: Stigmata of Connective Tissue Disease - no HEENT: Normal upper airway. PEERL +. No post nasal drip        Assessment/     Assessment & Plan ILD (interstitial lung disease) (HCC)  Lung nodule  Interstitial lung abnormality (ILA)    PLAN Patient Instructions  ILD /ILA  - no change in ILA x 1 year based on CT Aug 2025  Plan - Do Spiro/dlco in 1 year  - we will do CT in summer 2026 - Avoid respiratory illness sick exposure and control your risk for  respiratory infection  - be uptodate with all respiratory vaccines  - avoid sick contacts especially in areas of indoor clusters (churches, weddings, funerals, family gatherings, birthdays, planes, malls, indoor areas especially)   - mask in these areas   - discourage sick people from coming in close contact with you    Lung nodule June 2024  - resolved Aug 2025  Plan  -expectant followup  ROV  - 12 moths with app; symptom socre and exercise test at followup    FOLLOWUP    Return for  - 12 moths with app; symptom socre and exercise test at followup.    SIGNATURE    Dr. Dorethia Cave, M.D., F.C.C.P,  Pulmonary and Critical Care Medicine Staff Physician, [facility] Director - Interstitial Lung Disease  Program  Pulmonary Fibrosis [facility] at [facility] [place], [place], [address]  Pager: [phone], If no answer or between  15:00h - 7:00h: call [phone] Telephone: [phone]  11:58 AM 10/14/2024

## 2024-10-14 NOTE — Patient Instructions (Addendum)
 ILD /ILA  - no change in ILA x 1 year based on CT Aug 2025  Plan - Do Spiro/dlco in 1 year  - we will do CT in summer 2026 - Avoid respiratory illness sick exposure and control your risk for respiratory infection  - be uptodate with all respiratory vaccines  - avoid sick contacts especially in areas of indoor clusters (churches, weddings, funerals, family gatherings, birthdays, planes, malls, indoor areas especially)   - mask in these areas   - discourage sick people from coming in close contact with you    Lung nodule June 2024  - resolved Aug 2025  Plan  -expectant followup  ROV  - 12 moths with app; symptom socre and exercise test at followup

## 2024-10-20 ENCOUNTER — Encounter: Payer: Self-pay | Admitting: Hematology

## 2024-11-12 ENCOUNTER — Telehealth: Payer: Self-pay

## 2024-11-12 DIAGNOSIS — R932 Abnormal findings on diagnostic imaging of liver and biliary tract: Secondary | ICD-10-CM

## 2024-11-12 NOTE — Telephone Encounter (Signed)
-----   Message from Nurse Nyla BROCKS sent at 11/12/2024 10:31 AM EST ----- [facility] patient. ----- Message ----- From: Wonda Standing, RN Sent: 11/12/2024  12:00 AM EST To: Standing Wonda, RN  Personal reminder sent 11/13/2023 I recommend another MRI same protocol w/ and w/o contrast in 1 year - dx hemochromatosis, abnormal MRI liver

## 2024-11-12 NOTE — Telephone Encounter (Signed)
 I was able to reach pt by phone and he agrees to MRI- order placed and sent to the schedulers

## 2024-11-25 ENCOUNTER — Ambulatory Visit (HOSPITAL_COMMUNITY)

## 2024-11-26 ENCOUNTER — Ambulatory Visit (HOSPITAL_COMMUNITY)
Admission: RE | Admit: 2024-11-26 | Discharge: 2024-11-26 | Disposition: A | Source: Ambulatory Visit | Attending: Internal Medicine | Admitting: Internal Medicine

## 2024-11-26 DIAGNOSIS — R932 Abnormal findings on diagnostic imaging of liver and biliary tract: Secondary | ICD-10-CM | POA: Diagnosis present

## 2024-11-26 MED ORDER — GADOBUTROL 1 MMOL/ML IV SOLN
9.0000 mL | Freq: Once | INTRAVENOUS | Status: AC | PRN
  Administered 2024-11-26: 09:00:00 9 mL via INTRAVENOUS

## 2024-11-30 ENCOUNTER — Ambulatory Visit: Payer: Self-pay | Admitting: Internal Medicine

## 2025-01-15 NOTE — Telephone Encounter (Signed)
 Patient has hemochromatosis and findings of cirrhosis.  Hematology has recommended phlebotomy for ferritin greater than 500 due to anemia and chronic kidney disease.  I want to recheck his ferritin and iron saturation and hemoglobin.  I have concerns about progression of liver disease with the suggested phlebotomy regimen though I understand concerns about anemia.  I think I would favor a lower ferritin threshold of approximately 50 to 100.  Orders Placed This Encounter  Procedures   Iron, TIBC and Ferritin Panel

## 2025-10-13 ENCOUNTER — Inpatient Hospital Stay

## 2025-10-13 ENCOUNTER — Inpatient Hospital Stay: Admitting: Hematology
